# Patient Record
Sex: Female | Born: 1960 | Race: Black or African American | Hispanic: No | Marital: Married | Smoking: Never smoker
Health system: Southern US, Community
[De-identification: ages and names within clinical notes are randomized; demographics above are authoritative.]

## PROBLEM LIST (undated history)

## (undated) DIAGNOSIS — Z973 Presence of spectacles and contact lenses: Secondary | ICD-10-CM

## (undated) DIAGNOSIS — N950 Postmenopausal bleeding: Secondary | ICD-10-CM

## (undated) DIAGNOSIS — I454 Nonspecific intraventricular block: Secondary | ICD-10-CM

## (undated) DIAGNOSIS — M1990 Unspecified osteoarthritis, unspecified site: Secondary | ICD-10-CM

## (undated) DIAGNOSIS — I10 Essential (primary) hypertension: Secondary | ICD-10-CM

## (undated) HISTORY — DX: Nonspecific intraventricular block: I45.4

## (undated) HISTORY — PX: COLONOSCOPY: SHX174

## (undated) HISTORY — DX: Essential (primary) hypertension: I10

## (undated) HISTORY — DX: Unspecified osteoarthritis, unspecified site: M19.90

---

## 1998-05-11 ENCOUNTER — Ambulatory Visit (HOSPITAL_COMMUNITY): Admission: RE | Admit: 1998-05-11 | Discharge: 1998-05-11 | Payer: Self-pay | Admitting: Obstetrics

## 1998-06-29 ENCOUNTER — Inpatient Hospital Stay (HOSPITAL_COMMUNITY): Admission: AD | Admit: 1998-06-29 | Discharge: 1998-06-29 | Payer: Self-pay | Admitting: Obstetrics

## 1998-07-22 ENCOUNTER — Ambulatory Visit (HOSPITAL_COMMUNITY): Admission: RE | Admit: 1998-07-22 | Discharge: 1998-07-22 | Payer: Self-pay | Admitting: Obstetrics

## 1998-09-07 ENCOUNTER — Inpatient Hospital Stay (HOSPITAL_COMMUNITY): Admission: AD | Admit: 1998-09-07 | Discharge: 1998-09-10 | Payer: Self-pay | Admitting: Obstetrics

## 1998-09-07 ENCOUNTER — Encounter: Payer: Self-pay | Admitting: Obstetrics

## 1999-04-20 ENCOUNTER — Other Ambulatory Visit: Admission: RE | Admit: 1999-04-20 | Discharge: 1999-04-20 | Payer: Self-pay | Admitting: Obstetrics

## 1999-06-28 ENCOUNTER — Ambulatory Visit (HOSPITAL_COMMUNITY): Admission: RE | Admit: 1999-06-28 | Discharge: 1999-06-28 | Payer: Self-pay | Admitting: Obstetrics

## 1999-06-28 ENCOUNTER — Encounter: Payer: Self-pay | Admitting: Obstetrics

## 1999-08-11 ENCOUNTER — Inpatient Hospital Stay (HOSPITAL_COMMUNITY): Admission: AD | Admit: 1999-08-11 | Discharge: 1999-08-11 | Payer: Self-pay | Admitting: Obstetrics

## 1999-09-29 ENCOUNTER — Encounter: Payer: Self-pay | Admitting: Obstetrics

## 1999-09-29 ENCOUNTER — Ambulatory Visit (HOSPITAL_COMMUNITY): Admission: RE | Admit: 1999-09-29 | Discharge: 1999-09-29 | Payer: Self-pay | Admitting: Obstetrics

## 1999-10-06 ENCOUNTER — Inpatient Hospital Stay (HOSPITAL_COMMUNITY): Admission: AD | Admit: 1999-10-06 | Discharge: 1999-10-06 | Payer: Self-pay | Admitting: Obstetrics

## 1999-10-12 ENCOUNTER — Encounter (HOSPITAL_COMMUNITY): Admission: AD | Admit: 1999-10-12 | Discharge: 1999-12-08 | Payer: Self-pay | Admitting: Obstetrics

## 1999-10-20 ENCOUNTER — Encounter: Payer: Self-pay | Admitting: Obstetrics

## 1999-11-11 ENCOUNTER — Encounter: Payer: Self-pay | Admitting: Obstetrics

## 1999-11-14 ENCOUNTER — Encounter: Payer: Self-pay | Admitting: Obstetrics

## 1999-11-14 ENCOUNTER — Inpatient Hospital Stay (HOSPITAL_COMMUNITY): Admission: AD | Admit: 1999-11-14 | Discharge: 1999-11-14 | Payer: Self-pay | Admitting: Obstetrics

## 1999-11-29 ENCOUNTER — Inpatient Hospital Stay (HOSPITAL_COMMUNITY): Admission: AD | Admit: 1999-11-29 | Discharge: 1999-11-29 | Payer: Self-pay | Admitting: Obstetrics

## 1999-12-02 ENCOUNTER — Encounter: Payer: Self-pay | Admitting: Obstetrics

## 1999-12-07 ENCOUNTER — Inpatient Hospital Stay (HOSPITAL_COMMUNITY): Admission: AD | Admit: 1999-12-07 | Discharge: 1999-12-12 | Payer: Self-pay | Admitting: Obstetrics

## 2001-01-29 ENCOUNTER — Emergency Department (HOSPITAL_COMMUNITY): Admission: EM | Admit: 2001-01-29 | Discharge: 2001-01-30 | Payer: Self-pay | Admitting: Emergency Medicine

## 2001-11-20 ENCOUNTER — Encounter: Payer: Self-pay | Admitting: Obstetrics

## 2001-11-20 ENCOUNTER — Ambulatory Visit (HOSPITAL_COMMUNITY): Admission: RE | Admit: 2001-11-20 | Discharge: 2001-11-20 | Payer: Self-pay | Admitting: Obstetrics

## 2003-05-27 ENCOUNTER — Encounter: Payer: Self-pay | Admitting: Obstetrics

## 2003-05-27 ENCOUNTER — Ambulatory Visit (HOSPITAL_COMMUNITY): Admission: RE | Admit: 2003-05-27 | Discharge: 2003-05-27 | Payer: Self-pay | Admitting: Obstetrics

## 2003-11-26 ENCOUNTER — Other Ambulatory Visit: Admission: RE | Admit: 2003-11-26 | Discharge: 2003-11-26 | Payer: Self-pay | Admitting: Gynecology

## 2004-07-26 ENCOUNTER — Encounter: Admission: RE | Admit: 2004-07-26 | Discharge: 2004-07-26 | Payer: Self-pay | Admitting: Obstetrics

## 2005-04-12 ENCOUNTER — Ambulatory Visit (HOSPITAL_COMMUNITY): Admission: RE | Admit: 2005-04-12 | Discharge: 2005-04-12 | Payer: Self-pay | Admitting: Obstetrics

## 2005-11-22 ENCOUNTER — Inpatient Hospital Stay (HOSPITAL_COMMUNITY): Admission: RE | Admit: 2005-11-22 | Discharge: 2005-11-25 | Payer: Self-pay | Admitting: Obstetrics

## 2006-01-24 ENCOUNTER — Encounter: Payer: Self-pay | Admitting: Obstetrics

## 2006-05-14 ENCOUNTER — Encounter: Admission: RE | Admit: 2006-05-14 | Discharge: 2006-05-14 | Payer: Self-pay | Admitting: Cardiology

## 2007-11-12 ENCOUNTER — Encounter: Admission: RE | Admit: 2007-11-12 | Discharge: 2007-11-12 | Payer: Self-pay | Admitting: Obstetrics

## 2010-10-15 ENCOUNTER — Encounter: Payer: Self-pay | Admitting: Cardiology

## 2010-10-24 ENCOUNTER — Other Ambulatory Visit (HOSPITAL_COMMUNITY): Payer: Self-pay | Admitting: Obstetrics

## 2010-10-24 DIAGNOSIS — Z1239 Encounter for other screening for malignant neoplasm of breast: Secondary | ICD-10-CM

## 2010-10-31 ENCOUNTER — Other Ambulatory Visit (HOSPITAL_COMMUNITY): Payer: Self-pay | Admitting: Obstetrics

## 2010-10-31 DIAGNOSIS — Z1239 Encounter for other screening for malignant neoplasm of breast: Secondary | ICD-10-CM

## 2010-11-03 ENCOUNTER — Ambulatory Visit (HOSPITAL_COMMUNITY): Payer: Self-pay

## 2010-11-08 ENCOUNTER — Ambulatory Visit
Admission: RE | Admit: 2010-11-08 | Discharge: 2010-11-08 | Disposition: A | Discharge status: Home / Self Care | Payer: BC Managed Care – PPO | Source: Ambulatory Visit | Attending: Obstetrics | Admitting: Obstetrics

## 2010-11-08 DIAGNOSIS — Z1239 Encounter for other screening for malignant neoplasm of breast: Secondary | ICD-10-CM

## 2011-02-10 NOTE — Discharge Summary (Signed)
NAMESHANEECE, STOCKBURGER NO.:  [id number]   MEDICAL RECORD NO.:  [id number]          PATIENT TYPE:  INP   LOCATION:  9145                          FACILITY:  [facility]   PHYSICIAN:  Kathreen Cosier, M.D.DATE OF BIRTH:  Sep 26, 1960   DATE OF ADMISSION:  11/22/2005  DATE OF DISCHARGE:  11/25/2005                                 DISCHARGE SUMMARY   The patient is a 50 year old gravida 3, para 1-0-1-1, EDC December 01, 2005, she  had a previous C-section in the past and a positive GBS.  She was followed  with a nonstress test weekly because of previous history of IUFD and she  desired repeat C-section.  On February 28, she had a repeat section with a  female, Apgars 9 and 9 weighing 7 pounds 7 ounces.  On admission, hemoglobin  was 11.5 and postop 9.2, platelets 264 and 237.  Sodium 135, potassium 4 and  chloride 107.  Urinalysis was negative.  Postoperatively, she did well.  She  was discharged home on the third postoperative day on Tylox.  Hemoglobin was  11.5.  To see me in 6 weeks.   DISCHARGE DIAGNOSIS:  Status post repeat low-transverse cesarean section at  term.           ______________________________  Kathreen Cosier, M.D.     BAM/MEDQ  D:  12/06/2005  T:  12/06/2005  Job:  [id number]

## 2011-02-10 NOTE — Discharge Summary (Signed)
[facility]  Patient:    Ashley Burns, Ashley Burns                    MRN: [id number] Adm. Date:  [id number] Disc. Date: 12/12/99 Attending:  Venita Sheffield                           Discharge Summary  HISTORY OF PRESENT ILLNESS:   The patient is a 50 year old, gravida 2, para 0-1-0-0, with an intrauterine fetal demise in 1999 at 32 weeks.  Her EDC was December 08, 1999.  She was followed with nonstress tests from 32 weeks and she had a positive Group B Strep.  She was admitted for induction of labor at term.  The vertex was at -2 to -3.  The cervix was soft, but closed.  She was treated with IV ampicillin for Group B Strep.  Cytotec was placed and then the patient started n Pitocin.  She received Pitocin for 12 hours and then had ineffective contractions. She was rested, slept at night.  Cytotec reinserted and restarted on the morning of March 16 and received Pitocin for eight hours and had ineffective contractions ith no cervical change and no labor.  HOSPITAL COURSE:              She was delivered by a cesarean section for failed induction.  She had a 7 pound 12 ounce female from the OP position.  Her hemoglobin was 11.4 on admission and 10.9 post cesarean section.  She did well and was discharged home on the third postoperative day, ambulatory, on a regular diet, n Tylox one q.4h. p.r.n. to see me in six weeks.  DISCHARGE DIAGNOSES:          Status post primary low transverse cesarean section at term for failed induction. DD:  12/12/99 TD:  12/12/99 Job: [id number]

## 2011-02-10 NOTE — Op Note (Signed)
[name], Ashley NO.:  [id number]   MEDICAL RECORD NO.:  [id number]          PATIENT TYPE:  INP   LOCATION:  NA                            [facility]ACILITY:  [facility]   PHYSICIAN:  Kathreen Cosier, M.D.DATE OF BIRTH:  [birth year]   DATE OF PROCEDURE:  11/22/2005  DATE OF DISCHARGE:                                 OPERATIVE REPORT   PREOPERATIVE DIAGNOSIS:  Previous cesarean section, at term.  Desires  repeat.   SURGEON:  Kathreen Cosier, M.D.   FIRST ASSISTANT:  Charles A. Clearance Coots, M.D.   ANESTHESIA:  Spinal.   DESCRIPTION OF PROCEDURE:  The patient placed on the operating table in the  supine position.  Abdomen prepped and draped.  Bladder emptied with Foley  catheter.  Transverse suprapubic incision made through the old scar, carried  down through the rectus fascia.  The fascia was cleaned and incised the  length of the incision.  Rectus muscles were retracted laterally.  The  peritoneum was incised longitudinally.  Transverse incision made in the  vesicouterine peritoneum above the bladder.  The bladder mobilized  inferiorly.  Transverse lower uterine incision made.  The fluid was clear.  The patient was delivered from the LOA position of a female.  The Apgars were  9 and 9, weight 7 pounds 7 ounces.  The vacuum was used for delivery.  There  was no pop-off. The team was in attendance.  The placenta was anterior and  sent to labor and delivery.  Uterine cavity was cleaned with dry laps.  Uterine incision closed in one layer with continuous suture of #1 chromic.  Hemostasis was satisfactory.  Tubes and ovaries normal.  Abdomen closed in  layers.  Peritoneum with continuous suture of 0 chromic, fascia with  continuous suture of 0 Dexon and skin closed with subcuticular stitch of 4-0  Monocryl.  Blood loss 600 mL.  The patient tolerated the procedure well,  taken to the recovery room in good condition.           ______________________________  Kathreen Cosier, M.D.     BAM/MEDQ  D:  11/22/2005  T:  11/22/2005  Job:  [id number]

## 2011-10-26 ENCOUNTER — Other Ambulatory Visit: Payer: Self-pay | Admitting: Obstetrics

## 2011-10-26 DIAGNOSIS — Z1231 Encounter for screening mammogram for malignant neoplasm of breast: Secondary | ICD-10-CM

## 2011-11-14 ENCOUNTER — Ambulatory Visit
Admission: RE | Admit: 2011-11-14 | Discharge: 2011-11-14 | Disposition: A | Discharge status: Home / Self Care | Payer: BC Managed Care – PPO | Source: Ambulatory Visit | Attending: Obstetrics | Admitting: Obstetrics

## 2011-11-14 DIAGNOSIS — Z1231 Encounter for screening mammogram for malignant neoplasm of breast: Secondary | ICD-10-CM

## 2012-04-24 ENCOUNTER — Other Ambulatory Visit: Payer: Self-pay | Admitting: Cardiology

## 2012-04-24 ENCOUNTER — Ambulatory Visit
Admission: RE | Admit: 2012-04-24 | Discharge: 2012-04-24 | Disposition: A | Discharge status: Home / Self Care | Payer: BC Managed Care – PPO | Source: Ambulatory Visit | Attending: Cardiology | Admitting: Cardiology

## 2012-04-24 DIAGNOSIS — Z0000 Encounter for general adult medical examination without abnormal findings: Secondary | ICD-10-CM

## 2012-04-24 DIAGNOSIS — R52 Pain, unspecified: Secondary | ICD-10-CM

## 2012-08-29 ENCOUNTER — Other Ambulatory Visit (HOSPITAL_COMMUNITY): Payer: Self-pay | Admitting: *Deleted

## 2012-08-29 ENCOUNTER — Other Ambulatory Visit: Payer: Self-pay | Admitting: Cardiology

## 2012-08-29 DIAGNOSIS — R1310 Dysphagia, unspecified: Secondary | ICD-10-CM

## 2012-08-29 DIAGNOSIS — R109 Unspecified abdominal pain: Secondary | ICD-10-CM

## 2012-09-03 ENCOUNTER — Other Ambulatory Visit: Payer: Self-pay | Admitting: Cardiology

## 2012-09-03 ENCOUNTER — Ambulatory Visit
Admission: RE | Admit: 2012-09-03 | Discharge: 2012-09-03 | Disposition: A | Discharge status: Home / Self Care | Payer: BC Managed Care – PPO | Source: Ambulatory Visit | Attending: Cardiology | Admitting: Cardiology

## 2012-09-03 DIAGNOSIS — R609 Edema, unspecified: Secondary | ICD-10-CM

## 2012-09-03 DIAGNOSIS — R109 Unspecified abdominal pain: Secondary | ICD-10-CM

## 2012-09-05 ENCOUNTER — Ambulatory Visit (HOSPITAL_COMMUNITY)
Admission: RE | Admit: 2012-09-05 | Discharge: 2012-09-05 | Disposition: A | Discharge status: Home / Self Care | Payer: BC Managed Care – PPO | Source: Ambulatory Visit | Attending: Cardiology | Admitting: Cardiology

## 2012-09-05 DIAGNOSIS — M7989 Other specified soft tissue disorders: Secondary | ICD-10-CM

## 2012-09-05 DIAGNOSIS — R609 Edema, unspecified: Secondary | ICD-10-CM

## 2012-09-05 NOTE — Progress Notes (Signed)
*  Preliminary Results* Left lower extremity venous duplex completed. Left lower extremity is negative for deep vein thrombosis. There is no evidence of a left Baker's cyst.  Attempted to call preliminary results into Dr.Spruill's office, however the office was closed. Patient was instructed to go home, and if there are any further instructions for her she will be contacted by phone.  09/05/2012 10:07 AM Gertie Fey, RDMS, RDCS

## 2012-09-11 ENCOUNTER — Ambulatory Visit
Admission: RE | Admit: 2012-09-11 | Discharge: 2012-09-11 | Disposition: A | Discharge status: Home / Self Care | Payer: BC Managed Care – PPO | Source: Ambulatory Visit | Attending: Cardiology | Admitting: Cardiology

## 2012-09-11 DIAGNOSIS — R1310 Dysphagia, unspecified: Secondary | ICD-10-CM

## 2012-09-11 DIAGNOSIS — R109 Unspecified abdominal pain: Secondary | ICD-10-CM

## 2012-10-29 ENCOUNTER — Encounter (INDEPENDENT_AMBULATORY_CARE_PROVIDER_SITE_OTHER): Payer: Self-pay | Admitting: General Surgery

## 2012-10-29 ENCOUNTER — Ambulatory Visit (INDEPENDENT_AMBULATORY_CARE_PROVIDER_SITE_OTHER): Payer: BC Managed Care – PPO | Admitting: General Surgery

## 2012-10-29 VITALS — BP 108/62 | HR 56 | Temp 97.2°F | Resp 12 | Ht 66.0 in | Wt 204.0 lb

## 2012-10-29 DIAGNOSIS — K8020 Calculus of gallbladder without cholecystitis without obstruction: Secondary | ICD-10-CM

## 2012-10-29 NOTE — Progress Notes (Signed)
Patient ID: Ashley Burns, female   DOB: 01/25/1961, 52 y.o.   MRN: [id number]  Chief Complaint  Patient presents with  . New Evaluation    eval GB w/stones    HPI Ashley Burns is a 52 y.o. female.  For the evaluation of incidental gallstones noted on a recent ultrasound. HPI  The patient was being evaluated for esophageal spasms and discomfort. She was having some dysphasia. In the process an ultrasound of her abdomen was done which showed multiple gallstones.  I question the patient and her husband extensively for possible symptoms related to her gallstones. She's had no nausea or vomiting. She's had no abdominal pain on the right side or the epigastrium. She's had some mild left-sided abdominal pain however this was only yesterday. She's had no jaundice and no fevers or chills.  The problem that was being evaluated has resolved. She no longer has a dysphasia. History reviewed. No pertinent past medical history.  Past Surgical History  Procedure Date  . Cesarean section     x3    Family History  Problem Relation Age of Onset  . Cancer Mother     breast  . Diabetes Father   . Hypertension Father   . Heart disease Father   . Cancer Cousin     breast    Social History History  Substance Use Topics  . Smoking status: Never Smoker   . Smokeless tobacco: Never Used  . Alcohol Use: Not on file    No Known Allergies  No current outpatient prescriptions on file.    Review of Systems Review of Systems  Constitutional: Negative.   HENT: Negative.   Eyes: Negative.   Respiratory: Negative.   Cardiovascular: Negative.   Gastrointestinal: Negative.   Genitourinary: Negative.   Musculoskeletal: Negative.   Neurological: Negative.   Hematological: Negative.   Psychiatric/Behavioral: Negative.     Blood pressure 108/62, pulse 56, temperature 97.2 F (36.2 C), temperature source Temporal, resp. rate 12, height 5\' 6"  (1.676 m), weight 204 lb (92.534  kg).  Physical Exam Physical Exam  Constitutional: She is oriented to person, place, and time. She appears well-developed and well-nourished.  HENT:  Head: Normocephalic and atraumatic.  Eyes: Conjunctivae normal and EOM are normal. Pupils are equal, round, and reactive to light.  Neck: Normal range of motion. Neck supple.  Cardiovascular: Normal rate, regular rhythm and normal heart sounds.   Pulmonary/Chest: Effort normal.  Abdominal: Soft. Bowel sounds are normal. There is tenderness (mild LUQ tenderness).  Musculoskeletal: Normal range of motion.  Neurological: She is alert and oriented to person, place, and time. She has normal reflexes.  Skin: Skin is warm and dry.  Psychiatric: She has a normal mood and affect. Her behavior is normal. Judgment and thought content normal.    Data Reviewed Ultrasound  Assessment    Asymptomatic gallstones    Plan    Did not recommend surgery.  Watch diet as the patient is doing.  Come back to see me if there increase in symptoms.       Cherylynn Ridges 10/29/2012, 11:01 AM

## 2012-12-03 ENCOUNTER — Other Ambulatory Visit: Payer: Self-pay | Admitting: Obstetrics

## 2012-12-03 ENCOUNTER — Ambulatory Visit (INDEPENDENT_AMBULATORY_CARE_PROVIDER_SITE_OTHER): Payer: BC Managed Care – PPO

## 2012-12-03 DIAGNOSIS — Z1231 Encounter for screening mammogram for malignant neoplasm of breast: Secondary | ICD-10-CM

## 2012-12-03 DIAGNOSIS — R928 Other abnormal and inconclusive findings on diagnostic imaging of breast: Secondary | ICD-10-CM

## 2012-12-11 ENCOUNTER — Other Ambulatory Visit: Payer: Self-pay | Admitting: Obstetrics

## 2012-12-11 DIAGNOSIS — R928 Other abnormal and inconclusive findings on diagnostic imaging of breast: Secondary | ICD-10-CM

## 2012-12-20 ENCOUNTER — Other Ambulatory Visit: Payer: BC Managed Care – PPO

## 2014-08-04 ENCOUNTER — Other Ambulatory Visit: Payer: Self-pay | Admitting: Internal Medicine

## 2014-08-04 DIAGNOSIS — E049 Nontoxic goiter, unspecified: Secondary | ICD-10-CM

## 2014-08-12 ENCOUNTER — Ambulatory Visit (HOSPITAL_COMMUNITY)
Admission: RE | Admit: 2014-08-12 | Discharge: 2014-08-12 | Disposition: A | Discharge status: Home / Self Care | Payer: BC Managed Care – PPO | Source: Ambulatory Visit | Attending: Internal Medicine | Admitting: Internal Medicine

## 2014-08-12 DIAGNOSIS — E049 Nontoxic goiter, unspecified: Secondary | ICD-10-CM

## 2015-09-13 LAB — CYTOLOGY - PAP: PAP SMEAR: NEGATIVE

## 2016-07-10 ENCOUNTER — Ambulatory Visit: Payer: Self-pay | Admitting: Family Medicine

## 2016-07-18 NOTE — Progress Notes (Signed)
[facility] [place] Ashley Burns, Ashley Burns [address] Phone: [phone] Subjective:    I'm seeing this patient by the request  of:  SPRUILL,JEROME O, MD   CC: Right knee pain  [id number]  Ashley Burns is a 55 y.o. female coming in with complaint of right knee pain. Patient is had this pain for quite some time. Think since more of a popping sensation that to be what is concerning. Patient was running and then unfortunately had to stop. Has gained some weight since then. Now notices when she walks she has an audible clicking on the anterior aspect of the right knee and somewhat on the left knee. Patient is concerned she is doing damage. Denies any swelling, any radiation of pain, any numbness or tingling. Denies any instability of the knee. Denies any nighttime awakening. Rates the severity of pain is 3 out of 10 but is concerned with the noise more than anything else.     No past medical history on file. Past Surgical History:  Procedure Laterality Date  . CESAREAN SECTION     x3   Social History   Social History  . Marital status: Married    Spouse name: N/A  . Number of children: N/A  . Years of education: N/A   Social History Main Topics  . Smoking status: Never Smoker  . Smokeless tobacco: Never Used  . Alcohol use Not on file  . Drug use: No  . Sexual activity: Not on file   Other Topics Concern  . Not on file   Social History Narrative  . No narrative on file   No Known Allergies Family History  Problem Relation Age of Onset  . Cancer Mother     breast  . Diabetes Father   . Hypertension Father   . Heart disease Father   . Cancer Cousin     breast    Past medical history, social, surgical and family history all reviewed in electronic medical record.  No pertanent information unless stated regarding to the chief complaint.   Review of Systems: No headache, visual changes, nausea, vomiting, diarrhea, constipation,  dizziness, abdominal pain, skin rash, fevers, chills, night sweats, weight loss, swollen lymph nodes, body aches, joint swelling, muscle aches, chest pain, shortness of breath, mood changes.   Objective  There were no vitals taken for this visit.  General: No apparent distress alert and oriented x3 mood and affect normal, dressed appropriately.  HEENT: Pupils equal, extraocular movements intact  Respiratory: Patient's speak in full sentences and does not appear short of breath  Cardiovascular: No lower extremity edema, non tender, no erythema  Skin: Warm dry intact with no signs of infection or rash on extremities or on axial skeleton.  Abdomen: Soft nontender  Neuro: Cranial nerves II through XII are intact, neurovascularly intact in all extremities with 2+ DTRs and 2+ pulses.  Lymph: No lymphadenopathy of posterior or anterior cervical chain or axillae bilaterally.  Gait normal with good balance and coordination.  MSK:  Non tender with full range of motion and good stability and symmetric strength and tone of shoulders, elbows, wrist, hip, and ankles bilaterally.  Pes planus bilaterally Knee: Right Lateral tilt of the patella noted Tender to palpation minorly over the superior lateral patellofemoral joint.Marland Kitchen ROM full in flexion and extension and lower leg rotation. Ligaments with solid consistent endpoints including ACL, PCL, LCL, MCL. Negative Mcmurray's, Apley's, and Thessalonian tests. Mild painful patellar compression. Patellar glide with  mild crepitus. Patellar and quadriceps tendons unremarkable. Hamstring and quadriceps strength is normal.  Contralateral knee has some mild crepitus is well but nontender  MSK US performed of: Right This study was ordered, performed, and interpreted by Charlann Boxer D.O.  Knee: All structures visualized. Mild narrowing of the patellofemoral space mostly on the lateral aspect. Patient has no joint effusion. 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  This included exercises focusing on stretching, strengthening, with significant focus on eccentric aspects.  Patellofemoral Syndrome  Reviewed anatomy using anatomical Burns and how PFS occurs.  Given rehab exercises handout for VMO, hip abductors, core, entire kinetic chain including proprioception exercises including cone touches, step downs, hip elevations and turn outs.  Could benefit from PT, regular exercise, upright biking, and a PFS knee brace to assist with tracking abnormalities.  Proper technique shown and discussed handout in great detail with ATC.  All questions were discussed and answered.    Impression and Recommendations:     This case required medical decision making of moderate complexity.      Note: This dictation was prepared with Dragon dictation along with smaller phrase technology. Any transcriptional errors that result from this process are unintentional.

## 2016-07-19 ENCOUNTER — Ambulatory Visit: Payer: Self-pay

## 2016-07-19 ENCOUNTER — Ambulatory Visit (INDEPENDENT_AMBULATORY_CARE_PROVIDER_SITE_OTHER): Payer: BLUE CROSS/BLUE SHIELD | Admitting: Family Medicine

## 2016-07-19 ENCOUNTER — Encounter: Payer: Self-pay | Admitting: Family Medicine

## 2016-07-19 VITALS — BP 110/80 | HR 57 | Wt 233.0 lb

## 2016-07-19 DIAGNOSIS — M25561 Pain in right knee: Secondary | ICD-10-CM

## 2016-07-19 DIAGNOSIS — M222X1 Patellofemoral disorders, right knee: Secondary | ICD-10-CM | POA: Diagnosis not present

## 2016-07-19 DIAGNOSIS — M2141 Flat foot [pes planus] (acquired), right foot: Secondary | ICD-10-CM | POA: Diagnosis not present

## 2016-07-19 DIAGNOSIS — M2140 Flat foot [pes planus] (acquired), unspecified foot: Secondary | ICD-10-CM | POA: Insufficient documentation

## 2016-07-19 DIAGNOSIS — M222X2 Patellofemoral disorders, left knee: Secondary | ICD-10-CM

## 2016-07-19 DIAGNOSIS — M1711 Unilateral primary osteoarthritis, right knee: Secondary | ICD-10-CM | POA: Diagnosis not present

## 2016-07-19 DIAGNOSIS — E669 Obesity, unspecified: Secondary | ICD-10-CM | POA: Insufficient documentation

## 2016-07-19 DIAGNOSIS — M2142 Flat foot [pes planus] (acquired), left foot: Secondary | ICD-10-CM

## 2016-07-19 MED ORDER — DICLOFENAC SODIUM 2 % TD SOLN: TRANSDERMAL | 3 refills | Status: DC

## 2016-07-19 NOTE — Patient Instructions (Signed)
Good to see you.  Ice 20 minutes 2 times daily. Usually after activity and before bed. Exercises 3 times a week.  pennsaid pinkie amount topically 2 times daily as needed.  Vitamin D 2000 Udaily  Turmeric 500mg  twice daily  Try to do biking or elliptical for lower impact  See me again in 4 weeks.

## 2016-07-19 NOTE — Assessment & Plan Note (Signed)
Discussed OTC medications Discussed over-the-counter orthotics.

## 2016-07-19 NOTE — Assessment & Plan Note (Signed)
Patient patellofemoral syndrome. Also some underlying arthritis. Topical anti-inflammatories written for, home exercises given, we discussed icing regimen. Patient will stay active. Discussed proper shoes and over-the-counter orthotics for her pes planus. We discussed the importance of weight loss. Follow-up again in 4 weeks

## 2016-08-16 ENCOUNTER — Ambulatory Visit: Payer: BLUE CROSS/BLUE SHIELD | Admitting: Family Medicine

## 2016-09-04 ENCOUNTER — Ambulatory Visit: Payer: BLUE CROSS/BLUE SHIELD | Admitting: Family Medicine

## 2016-09-09 NOTE — Progress Notes (Deleted)
[facility] [place], [place] [address] Phone: [phone] Subjective:    I'm seeing this patient by the request  of:  SPRUILL,JEROME O, MD   CC: Right knee pain  [id number]  Ashley Burns is a 55 y.o. female coming in with complaint of right knee pain. Patient is had this pain for quite some time. Think since more of a popping sensation that to be what is concerning. Patient was running and then unfortunately had to stop. Has gained some weight since then. Now notices when she walks she has an audible clicking on the anterior aspect of the right knee and somewhat on the left knee. Patient is concerned she is doing damage. Denies any swelling, any radiation of pain, any numbness or tingling. Denies any instability of the knee. Denies any nighttime awakening. Rates the severity of pain is 3 out of 10 but is concerned with the noise more than anything else.     No past medical history on file. Past Surgical History:  Procedure Laterality Date  . CESAREAN SECTION     x3   Social History   Social History  . Marital status: Married    Spouse name: N/A  . Number of children: N/A  . Years of education: N/A   Social History Main Topics  . Smoking status: Never Smoker  . Smokeless tobacco: Never Used  . Alcohol use Not on file  . Drug use: No  . Sexual activity: Not on file   Other Topics Concern  . Not on file   Social History Narrative  . No narrative on file   No Known Allergies Family History  Problem Relation Age of Onset  . Cancer Mother     breast  . Diabetes Father   . Hypertension Father   . Heart disease Father   . Cancer Cousin     breast    Past medical history, social, surgical and family history all reviewed in electronic medical record.  No pertanent information unless stated regarding to the chief complaint.   Review of Systems: No headache, visual changes, nausea, vomiting, diarrhea, constipation,  dizziness, abdominal pain, skin rash, fevers, chills, night sweats, weight loss, swollen lymph nodes, body aches, joint swelling, muscle aches, chest pain, shortness of breath, mood changes.   Objective  There were no vitals taken for this visit.  General: No apparent distress alert and oriented x3 mood and affect normal, dressed appropriately.  HEENT: Pupils equal, extraocular movements intact  Respiratory: Patient's speak in full sentences and does not appear short of breath  Cardiovascular: No lower extremity edema, non tender, no erythema  Skin: Warm dry intact with no signs of infection or rash on extremities or on axial skeleton.  Abdomen: Soft nontender  Neuro: Cranial nerves II through XII are intact, neurovascularly intact in all extremities with 2+ DTRs and 2+ pulses.  Lymph: No lymphadenopathy of posterior or anterior cervical chain or axillae bilaterally.  Gait normal with good balance and coordination.  MSK:  Non tender with full range of motion and good stability and symmetric strength and tone of shoulders, elbows, wrist, hip, and ankles bilaterally.  Pes planus bilaterally Knee: Right Lateral tilt of the patella noted Tender to palpation minorly over the superior lateral patellofemoral joint.Marland Kitchen ROM full in flexion and extension and lower leg rotation. Ligaments with solid consistent endpoints including ACL, PCL, LCL, MCL. Negative Mcmurray's, Apley's, and Thessalonian tests. Mild painful patellar compression. Patellar glide with  mild crepitus. Patellar and quadriceps tendons unremarkable. Hamstring and quadriceps strength is normal.  Contralateral knee has some mild crepitus is well but nontender  MSK US performed of: Right This study was ordered, performed, and interpreted by Charlann Boxer D.O.  Knee: All structures visualized. Mild narrowing of the patellofemoral space mostly on the lateral aspect. Patient has no joint effusion. 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  This included exercises focusing on stretching, strengthening, with significant focus on eccentric aspects.  Patellofemoral Syndrome  Reviewed anatomy using anatomical model and how PFS occurs.  Given rehab exercises handout for VMO, hip abductors, core, entire kinetic chain including proprioception exercises including cone touches, step downs, hip elevations and turn outs.  Could benefit from PT, regular exercise, upright biking, and a PFS knee brace to assist with tracking abnormalities.  Proper technique shown and discussed handout in great detail with ATC.  All questions were discussed and answered.    Impression and Recommendations:     This case required medical decision making of moderate complexity.      Note: This dictation was prepared with Dragon dictation along with smaller phrase technology. Any transcriptional errors that result from this process are unintentional.

## 2016-09-11 ENCOUNTER — Ambulatory Visit: Payer: BLUE CROSS/BLUE SHIELD | Admitting: Family Medicine

## 2017-06-01 ENCOUNTER — Other Ambulatory Visit: Payer: Self-pay | Admitting: Obstetrics and Gynecology

## 2017-11-16 ENCOUNTER — Other Ambulatory Visit: Payer: Self-pay | Admitting: Internal Medicine

## 2017-11-16 DIAGNOSIS — R221 Localized swelling, mass and lump, neck: Secondary | ICD-10-CM

## 2017-11-22 ENCOUNTER — Other Ambulatory Visit: Payer: BLUE CROSS/BLUE SHIELD

## 2017-11-26 ENCOUNTER — Ambulatory Visit
Admission: RE | Admit: 2017-11-26 | Discharge: 2017-11-26 | Disposition: A | Discharge status: Home / Self Care | Payer: BLUE CROSS/BLUE SHIELD | Source: Ambulatory Visit | Attending: Internal Medicine | Admitting: Internal Medicine

## 2017-11-26 DIAGNOSIS — R221 Localized swelling, mass and lump, neck: Secondary | ICD-10-CM

## 2018-07-24 ENCOUNTER — Telehealth: Payer: Self-pay | Admitting: Internal Medicine

## 2018-07-24 NOTE — Telephone Encounter (Signed)
Patient has an appoint with Doreene Burke for her HM on Nov 15 and wants her to review Dr. Tamala Julian notes regarding her knees

## 2018-08-02 ENCOUNTER — Telehealth: Payer: Self-pay

## 2018-08-02 NOTE — Telephone Encounter (Signed)
Voicemail full on home phone and not setup on the cell phone.  I was returning the pt's call to reschedule her appt for Nov. 15th. For her physical.

## 2018-08-09 ENCOUNTER — Encounter: Payer: Self-pay | Admitting: Nurse Practitioner

## 2019-12-18 ENCOUNTER — Ambulatory Visit: Payer: Self-pay | Attending: Internal Medicine

## 2019-12-18 DIAGNOSIS — Z23 Encounter for immunization: Secondary | ICD-10-CM

## 2019-12-18 NOTE — Progress Notes (Signed)
   Covid-19 Vaccination Clinic  Name:  Ashley Burns    MRN: [id number] DOB: 05/02/1961  12/18/2019  [name] was observed post Covid-19 immunization for 15 minutes without incident. She was provided with Vaccine Information Sheet and instruction to access the V-Safe system.   [name] was instructed to call 911 with any severe reactions post vaccine: Marland Kitchen Difficulty breathing  . Swelling of face and throat  . A fast heartbeat  . A bad rash all over body  . Dizziness and weakness   Immunizations Administered    Name Date Dose VIS Date Route   Pfizer COVID-19 Vaccine 12/18/2019  3:20 PM 0.3 mL 09/05/2019 Intramuscular   Manufacturer: [place]   Lot: [id number]   [place]: [id number]

## 2020-01-12 ENCOUNTER — Ambulatory Visit: Payer: Self-pay | Attending: Internal Medicine

## 2020-01-12 DIAGNOSIS — Z23 Encounter for immunization: Secondary | ICD-10-CM

## 2020-01-12 NOTE — Progress Notes (Signed)
   Covid-19 Vaccination Clinic  Name:  Ashley Burns    MRN: [id number] DOB: December 03, 1960  01/12/2020  [name] was observed post Covid-19 immunization for 15 minutes without incident. She was provided with Vaccine Information Sheet and instruction to access the V-Safe system.   [name] was instructed to call 911 with any severe reactions post vaccine: Marland Kitchen Difficulty breathing  . Swelling of face and throat  . A fast heartbeat  . A bad rash all over body  . Dizziness and weakness   Immunizations Administered    Name Date Dose VIS Date Route   Pfizer COVID-19 Vaccine 01/12/2020  3:13 PM 0.3 mL 11/19/2018 Intramuscular   Manufacturer: [place]   Lot: [id number]   [place]: [id number]

## 2020-07-13 ENCOUNTER — Encounter: Payer: Self-pay | Admitting: Emergency Medicine

## 2020-07-13 ENCOUNTER — Other Ambulatory Visit: Payer: Self-pay

## 2020-07-13 ENCOUNTER — Emergency Department (INDEPENDENT_AMBULATORY_CARE_PROVIDER_SITE_OTHER)
Admission: EM | Admit: 2020-07-13 | Discharge: 2020-07-13 | Disposition: A | Discharge status: Home / Self Care | Payer: BC Managed Care – PPO | Source: Home / Self Care

## 2020-07-13 DIAGNOSIS — M5432 Sciatica, left side: Secondary | ICD-10-CM | POA: Diagnosis not present

## 2020-07-13 DIAGNOSIS — M5431 Sciatica, right side: Secondary | ICD-10-CM

## 2020-07-13 MED ORDER — DEXAMETHASONE SODIUM PHOSPHATE 10 MG/ML IJ SOLN
10.0000 mg | Freq: Once | INTRAMUSCULAR | Status: AC
  Administered 2020-07-13: 10 mg via INTRAMUSCULAR

## 2020-07-13 MED ORDER — TIZANIDINE HCL 4 MG PO TABS: 4.0000 mg | ORAL_TABLET | Freq: Every evening | ORAL | 0 refills | Status: DC | PRN

## 2020-07-13 MED ORDER — DICLOFENAC SODIUM 50 MG PO TBEC: 50.0000 mg | DELAYED_RELEASE_TABLET | Freq: Two times a day (BID) | ORAL | 0 refills | Status: DC | PRN

## 2020-07-13 MED ORDER — KETOROLAC TROMETHAMINE 60 MG/2ML IM SOLN
60.0000 mg | Freq: Once | INTRAMUSCULAR | Status: AC
  Administered 2020-07-13: 60 mg via INTRAMUSCULAR

## 2020-07-13 NOTE — ED Triage Notes (Signed)
Low back pain x 6 days. Worked at [employer] and strained back, possibly lifting something. Not sure.Today lifted her briefcase and it got worse.

## 2020-07-13 NOTE — ED Provider Notes (Signed)
Ashley Burns CARE    CSN: [id number] Arrival date & time: 07/13/20  1923      History   Chief Complaint Chief Complaint  Patient presents with  . Back Pain    HPI Ashley Burns is a 59 y.o. female.   HPI Patient presents today with acute back pain x6 days. 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 Today while working she lifted a briefcase and noticed back pain had worsened. She has taken over the counter medication without relief of symptoms. History reviewed. No pertinent past medical history.  Patient Active Problem List   Diagnosis Date Noted  . Patellofemoral syndrome of both knees 07/19/2016  . Pes planus 07/19/2016  . Degenerative arthritis of right knee 07/19/2016  . Obesity 07/19/2016  . Asymptomatic gallstones 10/29/2012    Past Surgical History:  Procedure Laterality Date  . CESAREAN SECTION     x3    OB History   No obstetric history on file.      Home Medications    Prior to Admission medications   Not on File    Family History Family History  Problem Relation Age of Onset  . Cancer Mother        breast  . Diabetes Father   . Hypertension Father   . Heart disease Father   . Cancer Cousin        breast    Social History Social History   Tobacco Use  . Smoking status: Never Smoker  . Smokeless tobacco: Never Used  Substance Use Topics  . Alcohol use: Yes  . Drug use: No     Allergies   Patient has no known allergies. Review of Systems Review of Systems Pertinent negatives listed in HPI  Physical Exam Triage Vital Signs ED Triage Vitals  Enc Vitals Group     BP 07/13/20 1936 (!) 147/97     Pulse Rate 07/13/20 1936 62     Resp --      Temp 07/13/20 1936 98 F (36.7 C)     Temp Source 07/13/20 1936 Oral     SpO2 07/13/20 1936 96 %     Weight 07/13/20 1937 230 lb (104.3 kg)     Height 07/13/20 1937 5\' 6"  (1.676 m)     Head  Circumference --      Peak Flow --      Pain Score 07/13/20 1937 9     Pain Loc --      Pain Edu? --      Excl. in [place]? --    No data found.  Updated Vital Signs BP (!) 147/97 (BP Location: Right Arm)   Pulse 62   Temp 98 F (36.7 C) (Oral)   Ht 5\' 6"  (1.676 m)   Wt 230 lb (104.3 kg)   SpO2 96%   BMI 37.12 kg/m   Visual Acuity Right Eye Distance:   Left Eye Distance:   Bilateral Distance:    Right Eye Near:   Left Eye Near:    Bilateral Near:     Physical Exam Constitutional:      Appearance: She is obese. She is not ill-appearing or toxic-appearing.  Cardiovascular:     Rate and Rhythm: Normal rate.  Pulmonary:     Effort: Pulmonary effort is normal.     Breath sounds: Normal breath sounds.  Musculoskeletal:     Cervical back: Normal range of  motion.     Thoracic back: Normal.     Lumbar back: Tenderness present. Decreased range of motion. Positive right straight leg raise test and positive left straight leg raise test.  Skin:    General: Skin is warm.     Capillary Refill: Capillary refill takes less than 2 seconds.  Neurological:     General: No focal deficit present.     Mental Status: She is alert and oriented to person, place, and time.     Motor: No weakness.  Psychiatric:        Attention and Perception: Attention normal.        Mood and Affect: Mood normal.        Speech: Speech normal.    UC Treatments / Results  Labs (all labs ordered are listed, but only abnormal results are displayed) Labs Reviewed - No data to display  EKG   Radiology No results found.  Procedures Procedures (including critical care time)  Medications Ordered in UC Medications  ketorolac (TORADOL) injection 60 mg (60 mg Intramuscular Given 07/13/20 2000)  dexamethasone (DECADRON) injection 10 mg (10 mg Intramuscular Given 07/13/20 2000)    Initial Impression / Assessment and Plan / UC Course  I have reviewed the triage vital signs and the nursing  notes.  Pertinent labs & imaging results that were available during my care of the patient were reviewed by me and considered in my medical decision making (see chart for details).    Acute bilateral sciatica, Toradol and Decadron IM given today. Zanaflex and Diclofenac PRN as needed. Follow-up with PCP  or orthopedics if symptoms worsen or do not improve . Final Clinical Impressions(s) / UC Diagnoses   Final diagnoses:  Bilateral sciatica     Discharge Instructions     Start Voltaren 50 mg once daily as needed.  You may take tizanidine 1 hour prior to bedtime as needed which is a muscle relaxer.  Avoid driving while taking medication.  Symptoms should resolve within the next 3 to 5 days.  Avoid any strenuous or heavy lifting activity as this can delay symptoms resolving.    ED Prescriptions    Medication Sig Dispense Auth. Provider   diclofenac (VOLTAREN) 50 MG EC tablet Take 1 tablet (50 mg total) by mouth 2 (two) times daily as needed. 30 tablet Scot Jun, FNP   tiZANidine (ZANAFLEX) 4 MG tablet Take 1 tablet (4 mg total) by mouth at bedtime as needed and may repeat dose one time if needed for muscle spasms. 30 tablet Scot Jun, FNP     PDMP not reviewed this encounter.   Scot Jun, [place] 07/16/20 [phone]

## 2020-07-13 NOTE — Discharge Instructions (Signed)
Start Voltaren 50 mg once daily as needed.  You may take tizanidine 1 hour prior to bedtime as needed which is a muscle relaxer.  Avoid driving while taking medication.  Symptoms should resolve within the next 3 to 5 days.  Avoid any strenuous or heavy lifting activity as this can delay symptoms resolving.

## 2020-07-25 ENCOUNTER — Other Ambulatory Visit: Payer: Self-pay | Admitting: Family Medicine

## 2020-11-15 ENCOUNTER — Telehealth: Payer: Self-pay

## 2020-11-15 NOTE — Telephone Encounter (Addendum)
The pt declined to see an NP because she said she wanted to have a physical with Dr. Baird Cancer.  The pt was told that Dr sanders physicals are scheduled out late in the year.  The pt said she was fine with waiting to see Dr. Baird Cancer.

## 2020-12-28 ENCOUNTER — Ambulatory Visit: Payer: Self-pay | Admitting: Nurse Practitioner

## 2021-01-03 ENCOUNTER — Encounter: Payer: Self-pay | Admitting: Nurse Practitioner

## 2021-01-03 ENCOUNTER — Ambulatory Visit: Payer: BC Managed Care – PPO | Admitting: Nurse Practitioner

## 2021-01-03 ENCOUNTER — Other Ambulatory Visit: Payer: Self-pay

## 2021-01-03 VITALS — BP 118/72 | HR 68 | Temp 98.1°F | Ht 66.0 in | Wt 249.0 lb

## 2021-01-03 DIAGNOSIS — Z1159 Encounter for screening for other viral diseases: Secondary | ICD-10-CM

## 2021-01-03 DIAGNOSIS — M25561 Pain in right knee: Secondary | ICD-10-CM

## 2021-01-03 DIAGNOSIS — R350 Frequency of micturition: Secondary | ICD-10-CM

## 2021-01-03 DIAGNOSIS — N898 Other specified noninflammatory disorders of vagina: Secondary | ICD-10-CM

## 2021-01-03 DIAGNOSIS — R2241 Localized swelling, mass and lump, right lower limb: Secondary | ICD-10-CM

## 2021-01-03 DIAGNOSIS — Z1211 Encounter for screening for malignant neoplasm of colon: Secondary | ICD-10-CM

## 2021-01-03 DIAGNOSIS — M25562 Pain in left knee: Secondary | ICD-10-CM | POA: Diagnosis not present

## 2021-01-03 LAB — POCT URINALYSIS DIPSTICK
Bilirubin, UA: NEGATIVE
Blood, UA: NEGATIVE
Glucose, UA: NEGATIVE
Ketones, UA: NEGATIVE
Leukocytes, UA: NEGATIVE
Nitrite, UA: NEGATIVE
Protein, UA: NEGATIVE
Spec Grav, UA: 1.025 (ref 1.010–1.025)
Urobilinogen, UA: 1 E.U./dL
pH, UA: 6 (ref 5.0–8.0)

## 2021-01-03 NOTE — Progress Notes (Signed)
I,Yamilka Roman [employer] as a Education administrator for [employer], FNP.,have documented all relevant documentation on the behalf of Minette Brine, FNP,as directed by  Minette Brine, FNP while in the presence of Minette Brine, [place]. This visit occurred during the SARS-CoV-2 public health emergency.  Safety protocols were in place, including screening questions prior to the visit, additional usage of staff PPE, and extensive cleaning of exam room while observing appropriate contact time as indicated for disinfecting solutions.  Subjective:     Patient ID: Ashley Burns , female    DOB: 06-Aug-1961 , 60 y.o.   MRN: [id number]   Chief Complaint  Patient presents with  . Back Pain  . Hip Pain    HPI  Patient presents today for an eval on an injury that she had back in Oct. 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 She stated she went to 3 different places for workers comp so she was told to come in to her pcp to be checked. She stated she was doing physical therapy and she feels they may have injured her, she feels like her knees were pulled out of socket. She also feels like there is a lump on her right thigh. She also reports having some urinary frequency. She has tried to see workers comp but there were staffing issues. She reports feeling like her knees were out of joint with her PT, reports she hears "funny noises to her knees". When she lies on right side she feels a "mound on her right hip". Had improved and now does not feel like progressing in the right direction. Does not get massages on a regular basis.   She has her office down stairs due to having pain to her right low back. She used to walk about 5 miles a day and would like to get back to exercising.    She is also having an increase in urination about every 2 hours.  She will sometimes have increased thirst.    She had injections after having pain after the injury  Right lump to thigh    History reviewed. No  pertinent past medical history.   Family History  Problem Relation Age of Onset  . Cancer Mother        breast  . Diabetes Father   . Hypertension Father   . Heart disease Father   . Cancer Cousin        breast     Current Outpatient Medications:  .  metroNIDAZOLE (FLAGYL) 500 MG tablet, Take 1 tablet (500 mg total) by mouth 3 (three) times daily for 10 days., Disp: 30 tablet, Rfl: 0   No Known Allergies   Review of Systems  Constitutional: Negative.   Respiratory: Negative.   Cardiovascular: Negative.  Negative for chest pain, palpitations and leg swelling.  Genitourinary: Negative.   Musculoskeletal: Positive for arthralgias. Negative for myalgias.  Neurological: Negative for dizziness.  Psychiatric/Behavioral: Negative.      Today's Vitals   01/03/21 1546  BP: 118/72  Pulse: 68  Temp: 98.1 F (36.7 C)  TempSrc: Oral  Weight: 249 lb (112.9 kg)  Height: 5' 6"  (1.676 m)  PainSc: 3    Body mass index is 40.19 kg/m.   Objective:  Physical Exam Vitals reviewed.  Constitutional:      General: She is not in acute distress.    Appearance: Normal appearance. She is obese.  Cardiovascular:     Rate and Rhythm: Normal rate  and regular rhythm.     Pulses: Normal pulses.     Heart sounds: Normal heart sounds. No murmur heard.   Pulmonary:     Effort: Pulmonary effort is normal. No respiratory distress.     Breath sounds: Normal breath sounds. No wheezing.  Skin:    Comments: Right thigh with lump present to anterior area  Neurological:     General: No focal deficit present.     Mental Status: She is alert and oriented to person, place, and time.     Cranial Nerves: No cranial nerve deficit.  Psychiatric:        Mood and Affect: Mood normal.        Behavior: Behavior normal.        Thought Content: Thought content normal.        Judgment: Judgment normal.         Assessment And Plan:     1. Urinary frequency  Urinalysis is normal  Will check for  metabolic causes - POCT Urinalysis Dipstick (81002) - Hemoglobin A1c - BMP8+eGFR - TSH  2. Acute pain of both knees  Will check knee xray due to ongoing pain - DG Knee Complete 4 Views Left; Future - DG Knee Complete 4 Views Right; Future  3. Encounter for hepatitis C screening test for low risk patient  Will check Hepatitis C screening due to recent recommendations to screen all adults 18 years and older - Hepatitis C antibody  4. Lump of right thigh  5. Encounter for screening colonoscopy  According to USPTF Colorectal cancer Screening guidelines. Colonoscopy is recommended every 10 years, starting at age 38 years.  Will refer to GI for colon cancer screening. - Ambulatory referral to Gastroenterology  6. Vaginal odor - metroNIDAZOLE (FLAGYL) 500 MG tablet; Take 1 tablet (500 mg total) by mouth 3 (three) times daily for 10 days.  Dispense: 30 tablet; Refill: 0     Patient was given opportunity to ask questions. Patient verbalized understanding of the plan and was able to repeat key elements of the plan. All questions were answered to their satisfaction.  Minette Brine, FNP   I, Minette Brine, FNP, have reviewed all documentation for this visit. The documentation on 01/11/21 for the exam, diagnosis, procedures, and orders are all accurate and complete.   IF YOU HAVE BEEN REFERRED TO A SPECIALIST, IT MAY TAKE 1-2 WEEKS TO SCHEDULE/PROCESS THE REFERRAL. IF YOU HAVE NOT HEARD FROM US/SPECIALIST IN TWO WEEKS, PLEASE GIVE [place] A CALL AT [phone].   THE PATIENT IS ENCOURAGED TO PRACTICE SOCIAL DISTANCING DUE TO THE COVID-19 PANDEMIC.

## 2021-01-04 LAB — BMP8+EGFR
BUN/Creatinine Ratio: 23 (ref 9–23)
BUN: 14 mg/dL (ref 6–24)
CO2: 22 mmol/L (ref 20–29)
Calcium: 9.6 mg/dL (ref 8.7–10.2)
Chloride: 101 mmol/L (ref 96–106)
Creatinine, Ser: 0.6 mg/dL (ref 0.57–1.00)
Glucose: 91 mg/dL (ref 65–99)
Potassium: 4 mmol/L (ref 3.5–5.2)
Sodium: 139 mmol/L (ref 134–144)
eGFR: 103 mL/min/{1.73_m2} (ref >59)

## 2021-01-04 LAB — HEPATITIS C ANTIBODY: Hep C Virus Ab: <0.1 s/co ratio (ref 0.0–0.9)

## 2021-01-04 LAB — HEMOGLOBIN A1C
Est. average glucose Bld gHb Est-mCnc: 123 mg/dL
Hgb A1c MFr Bld: 5.9 % — ABNORMAL HIGH (ref 4.8–5.6)

## 2021-01-04 LAB — TSH: TSH: 2.76 u[IU]/mL (ref 0.450–4.500)

## 2021-01-06 MED ORDER — METRONIDAZOLE 500 MG PO TABS: 500.0000 mg | ORAL_TABLET | Freq: Three times a day (TID) | ORAL | 0 refills | Status: AC

## 2021-01-12 ENCOUNTER — Telehealth: Payer: Self-pay

## 2021-01-12 NOTE — Telephone Encounter (Signed)
Voicemail full, called the pt with her lab results.

## 2021-01-12 NOTE — Telephone Encounter (Signed)
-----   Message from Minette Brine, [place] sent at 01/11/2021 11:48 PM EDT ----- All labs are normal except for Hgb1Ac is 5.9 this is considered prediabetic, limit intake of sugary foods and drinks.

## 2021-01-17 ENCOUNTER — Ambulatory Visit: Payer: BC Managed Care – PPO | Admitting: Nurse Practitioner

## 2021-01-19 ENCOUNTER — Ambulatory Visit: Payer: BC Managed Care – PPO | Admitting: Nurse Practitioner

## 2021-01-19 ENCOUNTER — Encounter: Payer: Self-pay | Admitting: Nurse Practitioner

## 2021-01-19 ENCOUNTER — Other Ambulatory Visit: Payer: Self-pay

## 2021-01-19 VITALS — BP 114/78 | HR 59 | Temp 98.0°F | Ht 66.0 in | Wt 247.6 lb

## 2021-01-19 DIAGNOSIS — G8929 Other chronic pain: Secondary | ICD-10-CM

## 2021-01-19 DIAGNOSIS — M25561 Pain in right knee: Secondary | ICD-10-CM | POA: Diagnosis not present

## 2021-01-19 DIAGNOSIS — F439 Reaction to severe stress, unspecified: Secondary | ICD-10-CM

## 2021-01-19 MED ORDER — DICLOFENAC SODIUM 1 % EX GEL: 2.0000 g | Freq: Four times a day (QID) | CUTANEOUS | 2 refills | Status: DC

## 2021-01-19 MED ORDER — KETOROLAC TROMETHAMINE 60 MG/2ML IM SOLN
60.0000 mg | Freq: Once | INTRAMUSCULAR | Status: AC
  Administered 2021-01-19: 60 mg via INTRAMUSCULAR

## 2021-01-19 NOTE — Progress Notes (Signed)
I,Yamilka Roman [employer] as a Education administrator for [employer], FNP.,have documented all relevant documentation on the behalf of Minette Brine, FNP,as directed by  Minette Brine, FNP while in the presence of Minette Brine, [place]. This visit occurred during the SARS-CoV-2 public health emergency.  Safety protocols were in place, including screening questions prior to the visit, additional usage of staff PPE, and extensive cleaning of exam room while observing appropriate contact time as indicated for disinfecting solutions.  Subjective:     Patient ID: Ashley Burns , female    DOB: August 02, 1961 , 60 y.o.   MRN: [id number]   Chief Complaint  Patient presents with  . Referral    Patient would like a referral to a family counselor     HPI  Patient would like a referral to a family counselor. She also stated she is getting her knee xray done tomorrow and she would like a note for work to be able to work virtually - remote modified shift. Continues to have pain to her knees and is scheduled for her ultrasound and xrays. She stated it is hard to walk while at work because she is having to walk a long distance. She is using pain cream and tylenol arthritis When standing for long periods she has pain as well. She has been having hair shedding, she has been under more stress.     Wt Readings from Last 3 Encounters: 01/19/21 : 247 lb 9.6 oz (112.3 kg) 01/03/21 : 249 lb (112.9 kg) 07/13/20 : 230 lb (104.3 kg)    History reviewed. No pertinent past medical history.   Family History  Problem Relation Age of Onset  . Cancer Mother        breast  . Diabetes Father   . Hypertension Father   . Heart disease Father   . Cancer Cousin        breast     Current Outpatient Medications:  .  diclofenac Sodium (VOLTAREN) 1 % GEL, Apply 2 g topically 4 (four) times daily., Disp: 100 g, Rfl: 2 .  metroNIDAZOLE (FLAGYL) 500 MG tablet, Take 500 mg by mouth 3 (three) times daily., Disp: , Rfl:    No Known  Allergies   Review of Systems  Constitutional: Negative.   Respiratory: Negative.   Cardiovascular: Negative.  Negative for chest pain, palpitations and leg swelling.  Musculoskeletal: Positive for arthralgias.  Neurological: Negative for dizziness and headaches.  Psychiatric/Behavioral: Negative.        She feels she is under increased stress related to work     [employer]   01/19/21 0921  BP: 114/78  Pulse: (!) 59  Temp: 98 F (36.7 C)  TempSrc: Oral  Weight: 247 lb 9.6 oz (112.3 kg)  Height: 5\' 6"  (1.676 m)  PainSc: 5   PainLoc: Knee   Body mass index is 39.96 kg/m.   Objective:  Physical Exam Vitals reviewed.  Constitutional:      General: She is not in acute distress.    Appearance: Normal appearance. She is obese.  Cardiovascular:     Rate and Rhythm: Normal rate and regular rhythm.     Pulses: Normal pulses.     Heart sounds: Normal heart sounds. No murmur heard.   Pulmonary:     Effort: Pulmonary effort is normal. No respiratory distress.     Breath sounds: Normal breath sounds. No wheezing.  Neurological:     General: No focal deficit present.     Mental Status: She is  alert and oriented to person, place, and time.     Cranial Nerves: No cranial nerve deficit.     Motor: No weakness.  Psychiatric:        Mood and Affect: Mood normal.        Behavior: Behavior normal.        Thought Content: Thought content normal.        Judgment: Judgment normal.         Assessment And Plan:     1. Stress  I have referred her to a counselor to help with her stress, she is having shedding of her hair, thyroid levels are normal. - Ambulatory referral to Psychology  2. Chronic pain of right knee  Continues to have pain to knee and she is having her ultrasound and knee xray tomorrow  Pending results will refer to orthopedics - ketorolac (TORADOL) injection 60 mg - diclofenac Sodium (VOLTAREN) 1 % GEL; Apply 2 g topically 4 (four) times daily.  Dispense:  100 g; Refill: 2     Patient was given opportunity to ask questions. Patient verbalized understanding of the plan and was able to repeat key elements of the plan. All questions were answered to their satisfaction.  Minette Brine, FNP   I, Minette Brine, FNP, have reviewed all documentation for this visit. The documentation on 01/19/21 for the exam, diagnosis, procedures, and orders are all accurate and complete.   IF YOU HAVE BEEN REFERRED TO A SPECIALIST, IT MAY TAKE 1-2 WEEKS TO SCHEDULE/PROCESS THE REFERRAL. IF YOU HAVE NOT HEARD FROM US/SPECIALIST IN TWO WEEKS, PLEASE GIVE [place] A CALL AT [phone].   THE PATIENT IS ENCOURAGED TO PRACTICE SOCIAL DISTANCING DUE TO THE COVID-19 PANDEMIC.

## 2021-01-20 ENCOUNTER — Ambulatory Visit
Admission: RE | Admit: 2021-01-20 | Discharge: 2021-01-20 | Disposition: A | Discharge status: Home / Self Care | Payer: Self-pay | Source: Ambulatory Visit | Attending: Nurse Practitioner | Admitting: Nurse Practitioner

## 2021-01-20 ENCOUNTER — Ambulatory Visit
Admission: RE | Admit: 2021-01-20 | Discharge: 2021-01-20 | Disposition: A | Discharge status: Home / Self Care | Payer: BC Managed Care – PPO | Source: Ambulatory Visit | Attending: Nurse Practitioner | Admitting: Nurse Practitioner

## 2021-01-20 DIAGNOSIS — M25562 Pain in left knee: Secondary | ICD-10-CM

## 2021-01-20 DIAGNOSIS — M25561 Pain in right knee: Secondary | ICD-10-CM

## 2021-01-20 DIAGNOSIS — R2241 Localized swelling, mass and lump, right lower limb: Secondary | ICD-10-CM

## 2021-01-31 ENCOUNTER — Telehealth: Payer: Self-pay

## 2021-01-31 ENCOUNTER — Other Ambulatory Visit: Payer: Self-pay

## 2021-01-31 DIAGNOSIS — D1723 Benign lipomatous neoplasm of skin and subcutaneous tissue of right leg: Secondary | ICD-10-CM

## 2021-01-31 NOTE — Telephone Encounter (Signed)
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 This is fatty tissue and is a benign issue. Would you like the referral?  Kindest Regards,   Minette Brine, DNP, FNP-BC  Patient notified she was interested in referral to a surgeon so I have placed the referral. Ashley Burns

## 2021-02-18 ENCOUNTER — Ambulatory Visit
Admission: RE | Admit: 2021-02-18 | Discharge: 2021-02-18 | Disposition: A | Discharge status: Home / Self Care | Payer: Worker's Compensation | Source: Ambulatory Visit | Attending: Nurse Practitioner | Admitting: Nurse Practitioner

## 2021-03-30 ENCOUNTER — Telehealth: Payer: Self-pay | Admitting: *Deleted

## 2021-03-30 ENCOUNTER — Ambulatory Visit (AMBULATORY_SURGERY_CENTER): Payer: BC Managed Care – PPO | Admitting: *Deleted

## 2021-03-30 ENCOUNTER — Other Ambulatory Visit: Payer: Self-pay

## 2021-03-30 VITALS — Ht 66.0 in | Wt 230.0 lb

## 2021-03-30 DIAGNOSIS — Z1211 Encounter for screening for malignant neoplasm of colon: Secondary | ICD-10-CM

## 2021-03-30 MED ORDER — PLENVU 140 G PO SOLR: 1.0000 | Freq: Once | ORAL | 0 refills | Status: AC

## 2021-03-30 NOTE — Progress Notes (Signed)
Pt's previsit is done over the phone and all paperwork (prep instructions, blank consent form to just read over) sent to patient.  Pt's name and DOB verified at the beginning of the previsit.  Pt denies any difficulty with ambulating.    No trouble with anesthesia, denies trouble moving neck   With pt's third c section, pt states, "I had to be under a blanket after surgery d/t my temperature dropping."  Will make Osvaldo Angst CRNA aware of this.  Pt states, "I had my last colonoscopy more than 10 years ago.  I didn't have any polyps then." No FHCC.  Will make Dr. Tarri Glenn aware of this   No egg or soy allergy  No home oxygen use   No medications for weight loss taken  emmi information given  Pt denies constipation issues  Pt informed that we do not do prior authorizations for prep  Plenvu coupon given and code put into RX

## 2021-03-30 NOTE — Telephone Encounter (Signed)
noted 

## 2021-03-30 NOTE — Telephone Encounter (Signed)
Dr. Tarri Glenn and Jenny Reichmann,  I did this pt's PV today.  Two things I would like to pass along.  Dr. Tarri Glenn,  she states she had her last colonoscopy greater than 10 years ago- no polyps and no FHCC.  She doesn't have any record of this, just wanted to let you know.  Also, she states with her third c section, "they had to put me under a special blanket because my temperature kept dropping.  This only happened with my third c section- no issues any other time."  John, please advise.  Thanks, [employer]

## 2021-04-07 ENCOUNTER — Telehealth: Payer: Self-pay | Admitting: Gastroenterology

## 2021-04-08 ENCOUNTER — Telehealth: Payer: Self-pay | Admitting: Gastroenterology

## 2021-04-08 NOTE — Telephone Encounter (Signed)
Attempted pt- no answer- LM to call Back  Changed her to Miralax Gatorade prep- new instructions sent via her My Chart today -   Lelan Pons PV

## 2021-04-08 NOTE — Telephone Encounter (Signed)
Inbound call from patient asking if she can get other prep because Plenvue was $60 and she would like to save money if she can. Procedure is 7//19 with Dr. Tarri Glenn. Best contact number [phone]

## 2021-04-08 NOTE — Telephone Encounter (Signed)
Patient returned call requesting I call pharmacy to explain what we were doing so she could return Plenvu. Pharmacy advised they would refund patient. Patient acknowledged  receipt of new MiraLax instructions and agrees with new prep.

## 2021-04-12 ENCOUNTER — Other Ambulatory Visit: Payer: Self-pay

## 2021-04-12 ENCOUNTER — Ambulatory Visit (AMBULATORY_SURGERY_CENTER): Payer: BC Managed Care – PPO | Admitting: Gastroenterology

## 2021-04-12 ENCOUNTER — Encounter: Payer: Self-pay | Admitting: Gastroenterology

## 2021-04-12 VITALS — BP 128/80 | HR 57 | Temp 97.4°F | Resp 12 | Ht 66.0 in | Wt 230.0 lb

## 2021-04-12 DIAGNOSIS — Z1211 Encounter for screening for malignant neoplasm of colon: Secondary | ICD-10-CM | POA: Diagnosis present

## 2021-04-12 DIAGNOSIS — D123 Benign neoplasm of transverse colon: Secondary | ICD-10-CM | POA: Diagnosis not present

## 2021-04-12 MED ORDER — SODIUM CHLORIDE 0.9 % IV SOLN: 500.0000 mL | INTRAVENOUS | Status: DC

## 2021-04-12 NOTE — Progress Notes (Signed)
Called to room to assist during endoscopic procedure.  Patient ID and intended procedure confirmed with present staff. Received instructions for my participation in the procedure from the performing physician.  

## 2021-04-12 NOTE — Progress Notes (Signed)
Report to PACU, RN, vss, BBS= Clear.  

## 2021-04-12 NOTE — Op Note (Signed)
[facility] Patient Name: Shelisa Fern Procedure Date: 04/12/2021 4:05 PM MRN: [id number] Endoscopist: Thornton Park MD, MD Age: 60 Referring MD:  Date of Birth: December 25, 1960 Gender: Female Account #: [id number] Procedure:                Colonoscopy Indications:              Screening for colorectal malignant neoplasm                           No known family history of colon cancer or polyps                           Prior colonoscopy >10 years ago with Dr. Michail Sermon Medicines:                Monitored Anesthesia Care Procedure:                Pre-Anesthesia Assessment:                           - Prior to the procedure, a History and Physical                            was performed, and patient medications and                            allergies were reviewed. The patient's tolerance of                            previous anesthesia was also reviewed. The risks                            and benefits of the procedure and the sedation                            options and risks were discussed with the patient.                            All questions were answered, and informed consent                            was obtained. Prior Anticoagulants: The patient has                            taken no previous anticoagulant or antiplatelet                            agents. ASA Grade Assessment: II - A patient with                            mild systemic disease. After reviewing the risks                            and benefits, the patient was deemed in  satisfactory condition to undergo the procedure.                           After obtaining informed consent, the colonoscope                            was passed under direct vision. Throughout the                            procedure, the patient's blood pressure, pulse, and                            oxygen saturations were monitored continuously. The                            Olympus  CF-HQ190L ([id number]) Colonoscope was                            introduced through the anus and advanced to the 3                            cm into the ileum. The colonoscopy was performed                            without difficulty. The patient tolerated the                            procedure well. The quality of the bowel                            preparation was good. The terminal ileum, ileocecal                            valve, appendiceal orifice, and rectum were                            photographed. Scope In: 4:12:17 PM Scope Out: 4:28:16 PM Scope Withdrawal Time: 0 hours 12 minutes 55 seconds  Total Procedure Duration: 0 hours 15 minutes 59 seconds  Findings:                 Skin tags were found on perianal exam.                           A 2 mm polyp was found in the proximal transverse                            colon. The polyp was sessile. The polyp was removed                            with a cold snare. Resection and retrieval were                            complete. Estimated blood loss was minimal.  Multiple small and large-mouthed diverticula were                            found in the sigmoid colon, descending colon,                            transverse colon and ascending colon. Complications:            No immediate complications. Estimated blood loss:                            Minimal. Estimated Blood Loss:     Estimated blood loss was minimal. Impression:               - Perianal skin tags found on perianal exam.                           - One 2 mm polyp in the proximal transverse colon,                            removed with a cold snare. Resected and retrieved.                           - The examination was otherwise normal on direct                            and retroflexion views. Recommendation:           - Patient has a contact number available for                            emergencies. The signs and symptoms of  potential                            delayed complications were discussed with the                            patient. Return to normal activities tomorrow.                            Written discharge instructions were provided to the                            patient.                           - Resume previous diet. High fiber diet recommended.                           - Continue present medications.                           - Await pathology results.                           - Repeat colonoscopy date to be determined after  pending pathology results are reviewed for                            surveillance.                           - Emerging evidence supports eating a diet of                            fruits, vegetables, grains, calcium, and yogurt                            while reducing red meat and alcohol may reduce the                            risk of colon cancer.                           - Thank you for allowing me to be involved in your                            colon cancer prevention. Thornton Park MD, MD 04/12/2021 4:33:48 PM This report has been signed electronically.

## 2021-04-12 NOTE — Patient Instructions (Signed)
YOU HAD AN ENDOSCOPIC PROCEDURE TODAY AT THE [facility]:   Refer to the procedure report that was given to you for any specific questions about what was found during the examination.  If the procedure report does not answer your questions, please call your gastroenterologist to clarify.  If you requested that your care partner not be given the details of your procedure findings, then the procedure report has been included in a sealed envelope for you to review at your convenience later.  YOU SHOULD EXPECT: Some feelings of bloating in the abdomen. Passage of more gas than usual.  Walking can help get rid of the air that was put into your GI tract during the procedure and reduce the bloating. If you had a lower endoscopy (such as a colonoscopy or flexible sigmoidoscopy) you may notice spotting of blood in your stool or on the toilet paper. If you underwent a bowel prep for your procedure, you may not have a normal bowel movement for a few days.  Please Note:  You might notice some irritation and congestion in your nose or some drainage.  This is from the oxygen used during your procedure.  There is no need for concern and it should clear up in a day or so.  SYMPTOMS TO REPORT IMMEDIATELY:   Following lower endoscopy (colonoscopy or flexible sigmoidoscopy):  Excessive amounts of blood in the stool  Significant tenderness or worsening of abdominal pains  Swelling of the abdomen that is new, acute  Fever of 100F or higher   Following upper endoscopy (EGD)  Vomiting of blood or coffee ground material  New chest pain or pain under the shoulder blades  Painful or persistently difficult swallowing  New shortness of breath  Fever of 100F or higher  Black, tarry-looking stools  For urgent or emergent issues, a gastroenterologist can be reached at any hour by calling [phone]. Do not use MyChart messaging for urgent concerns.    DIET:  We do recommend a small meal at first, but  then you may proceed to your regular diet.  Drink plenty of fluids but you should avoid alcoholic beverages for 24 hours.  ACTIVITY:  You should plan to take it easy for the rest of today and you should NOT DRIVE or use heavy machinery until tomorrow (because of the sedation medicines used during the test).    FOLLOW UP: Our staff will call the number listed on your records 48-72 hours following your procedure to check on you and address any questions or concerns that you may have regarding the information given to you following your procedure. If we do not reach you, we will leave a message.  We will attempt to reach you two times.  During this call, we will ask if you have developed any symptoms of COVID 19. If you develop any symptoms (ie: fever, flu-like symptoms, shortness of breath, cough etc.) before then, please call [phone].  If you test positive for Covid 19 in the 2 weeks post procedure, please call and report this information to us.    If any biopsies were taken you will be contacted by phone or by letter within the next 1-3 weeks.  Please call us at [phone] if you have not heard about the biopsies in 3 weeks.    SIGNATURES/CONFIDENTIALITY: You and/or your care partner have signed paperwork which will be entered into your electronic medical record.  These signatures attest to the fact that that the information above on   your After Visit Summary has been reviewed and is understood.  Full responsibility of the confidentiality of this discharge information lies with you and/or your care-partner. 

## 2021-04-18 ENCOUNTER — Encounter: Payer: Self-pay | Admitting: Gastroenterology

## 2021-04-24 ENCOUNTER — Encounter: Payer: Self-pay | Admitting: Nurse Practitioner

## 2021-04-25 ENCOUNTER — Ambulatory Visit: Payer: BC Managed Care – PPO | Admitting: Nurse Practitioner

## 2021-04-25 HISTORY — PX: COLONOSCOPY: SHX174

## 2021-05-12 ENCOUNTER — Other Ambulatory Visit: Payer: Self-pay

## 2021-05-12 ENCOUNTER — Encounter: Payer: Self-pay | Admitting: Internal Medicine

## 2021-05-12 ENCOUNTER — Ambulatory Visit: Payer: BC Managed Care – PPO | Admitting: Internal Medicine

## 2021-05-12 VITALS — BP 122/64 | HR 52 | Temp 98.5°F | Ht 66.0 in | Wt 241.4 lb

## 2021-05-12 DIAGNOSIS — R7309 Other abnormal glucose: Secondary | ICD-10-CM | POA: Diagnosis not present

## 2021-05-12 DIAGNOSIS — M1711 Unilateral primary osteoarthritis, right knee: Secondary | ICD-10-CM | POA: Diagnosis not present

## 2021-05-12 DIAGNOSIS — E6609 Other obesity due to excess calories: Secondary | ICD-10-CM | POA: Diagnosis not present

## 2021-05-12 DIAGNOSIS — G8929 Other chronic pain: Secondary | ICD-10-CM

## 2021-05-12 DIAGNOSIS — Z23 Encounter for immunization: Secondary | ICD-10-CM

## 2021-05-12 DIAGNOSIS — Z6838 Body mass index (BMI) 38.0-38.9, adult: Secondary | ICD-10-CM

## 2021-05-12 MED ORDER — TETANUS-DIPHTH-ACELL PERTUSSIS 5-2.5-18.5 LF-MCG/0.5 IM SUSY
0.5000 mL | PREFILLED_SYRINGE | Freq: Once | INTRAMUSCULAR | Status: AC
  Administered 2021-05-12: 0.5 mL via INTRAMUSCULAR

## 2021-05-12 NOTE — Progress Notes (Signed)
I,Tianna Badgett,acting as a Education administrator for Maximino Greenland, MD.,have documented all relevant documentation on the behalf of Maximino Greenland, MD,as directed by  Maximino Greenland, MD while in the presence of Maximino Greenland, MD.  This visit occurred during the SARS-CoV-2 public health emergency.  Safety protocols were in place, including screening questions prior to the visit, additional usage of staff PPE, and extensive cleaning of exam room while observing appropriate contact time as indicated for disinfecting solutions.  Subjective:     Patient ID: Ashley Burns , female    DOB: [birth year] , 60 y.o.   MRN: [id number]   Chief Complaint  Patient presents with  . Knee Pain    HPI  She presents today for further evaluation of knee pain. Denies fall/trauma. Has yet to be seen by [facility].   Knee Pain  The incident occurred more than 1 week ago. There was no injury mechanism. The pain is present in the right knee. The quality of the pain is described as aching. The pain is at a severity of 5/10. The pain is moderate. The pain has been Fluctuating since onset.    Past Medical History:  Diagnosis Date  . Arthritis   . Bundle branch block   . Hypertension    with pregnancy only     Family History  Problem Relation Age of Onset  . Cancer Mother        breast  . Diabetes Father   . Hypertension Father   . Heart disease Father   . Cancer Cousin        breast  . Colon cancer Neg Hx   . Esophageal cancer Neg Hx   . Stomach cancer Neg Hx   . Rectal cancer Neg Hx      Current Outpatient Medications:  .  BIOTIN PO, Take by mouth., Disp: , Rfl:  .  CALCIUM PO, Take by mouth. Every other day, Disp: , Rfl:  .  Cholecalciferol (VITAMIN D3 PO), Take by mouth daily., Disp: , Rfl:  .  Cyanocobalamin (VITAMIN B-12 PO), Take by mouth daily., Disp: , Rfl:  .  diclofenac Sodium (VOLTAREN) 1 % GEL, Apply 2 g topically 4 (four) times daily., Disp: 100 g, Rfl: 2 .  Ferrous Sulfate (IRON PO), Take by  mouth daily., Disp: , Rfl:  .  Multiple Vitamins-Minerals (ZINC PO), Take by mouth daily., Disp: , Rfl:  .  TURMERIC PO, Take by mouth daily., Disp: , Rfl:    Allergies  Allergen Reactions  . Shellfish Allergy     itching     Review of Systems  Constitutional: Negative.   Respiratory: Negative.    Cardiovascular: Negative.   Gastrointestinal: Negative.   Musculoskeletal:  Positive for arthralgias.  Neurological: Negative.     Today's Vitals   05/12/21 0937  BP: 122/64  Pulse: (!) 52  Temp: 98.5 F (36.9 C)  TempSrc: Oral  Weight: 241 lb 6.4 oz (109.5 kg)  Height: '5\' 6"'$  (1.676 m)   Body mass index is 38.96 kg/m.  Wt Readings from Last 3 Encounters:  05/12/21 241 lb 6.4 oz (109.5 kg)  04/12/21 230 lb (104.3 kg)  03/30/21 230 lb (104.3 kg)    Objective:  Physical Exam Vitals and nursing note reviewed.  Constitutional:      Appearance: Normal appearance.  HENT:     Head: Normocephalic and atraumatic.     Nose:     Comments: Masked     Mouth/Throat:  Comments: Masked  Eyes:     Extraocular Movements: Extraocular movements intact.  Cardiovascular:     Rate and Rhythm: Normal rate and regular rhythm.     Heart sounds: Normal heart sounds.  Pulmonary:     Effort: Pulmonary effort is normal.     Breath sounds: Normal breath sounds.  Musculoskeletal:        General: Swelling and tenderness present.     Cervical back: Normal range of motion.  Skin:    General: Skin is warm.  Neurological:     General: No focal deficit present.     Mental Status: She is alert.  Psychiatric:        Mood and Affect: Mood normal.        Behavior: Behavior normal.        Assessment And Plan:     1. Chronic pain of right knee Comments: She agrees to Ortho referral for further evaluation and radiographic studies.  - Ambulatory referral to Orthopedic Surgery  2. Other abnormal glucose Comments: Her a1c has been elevated in the past . I will recheck labs as listed below. She  is encouraged to limit intake of sweetened beverages, including diet.  - Hemoglobin A1c - Insulin, random(561)  3. Class 2 obesity due to excess calories without serious comorbidity with body mass index (BMI) of 38.0 to 38.9 in adult Comments: She states she has not gained 11 lbs. States weight entered incorrectly at another facility.   4. Immunization due - Tdap (BOOSTRIX) injection 0.5 mL    Patient was given opportunity to ask questions. Patient verbalized understanding of the plan and was able to repeat key elements of the plan. All questions were answered to their satisfaction.   I, Maximino Greenland, MD, have reviewed all documentation for this visit. The documentation on 05/16/21 for the exam, diagnosis, procedures, and orders are all accurate and complete.   IF YOU HAVE BEEN REFERRED TO A SPECIALIST, IT MAY TAKE 1-2 WEEKS TO SCHEDULE/PROCESS THE REFERRAL. IF YOU HAVE NOT HEARD FROM US/SPECIALIST IN TWO WEEKS, PLEASE GIVE [place] A CALL AT [phone].   THE PATIENT IS ENCOURAGED TO PRACTICE SOCIAL DISTANCING DUE TO THE COVID-19 PANDEMIC.

## 2021-05-12 NOTE — Patient Instructions (Signed)
Preventing Type 2 Diabetes Mellitus Type 2 diabetes, also called type 2 diabetes mellitus, is a long-term (chronic) disease that affects sugar (glucose) levels in your blood. Normally, a hormone called insulin allows glucose to enter cells in your body. The cells use glucose for energy. With type 2 diabetes, you will have one or both of these problems: Your pancreas does not make enough insulin. Cells in your body do not respond properly to insulin that your body makes (insulin resistance). Insulin resistance or lack of insulin causes extra glucose to build up in the blood instead of going into cells. As a result, high blood glucose (hyperglycemia) develops. That can cause many complications. Being overweight or obese and having an inactive (sedentary) lifestyle can increase your risk for diabetes. Type 2 diabetes can be delayedor prevented by making certain nutrition and lifestyle changes. How can this condition affect me? If you do not take steps to prevent diabetes, your blood glucose levels may keep increasing over time. Too much glucose in your blood for a long time candamage your blood vessels, heart, kidneys, nerves, and eyes. Type 2 diabetes can lead to chronic health problems and complications, such as: Heart disease. Stroke. Blindness. Kidney disease. Depression. Poor circulation in your feet and legs. In severe cases, a foot or leg may need to be surgically removed (amputated). What can increase my risk? You may be more likely to develop type 2 diabetes if you: Have type 2 diabetes in your family. Are overweight or obese. Have a sedentary lifestyle. Have insulin resistance or a history of prediabetes. Have a history of pregnancy-related (gestational) diabetes or polycystic ovary syndrome (PCOS). What actions can I take to prevent this? It can be difficult to recognize signs of type 2 diabetes. Taking action to prevent the disease before you develop symptoms is the best way to avoid  possible damage to your body. Making certain nutrition and lifestyle changesmay prevent or delay the disease and related health problems. Nutrition  Eat healthy meals and snacks regularly. Do not skip meals. Fruit or a handful of nuts is a healthy snack between meals. Drink water throughout the day. Avoid drinks that contain added sugar, such as soda or sweetened tea. Drink enough fluid to keep your urine pale yellow. Follow instructions from your health care provider about eating or drinking restrictions. Limit the amount of food you eat by: Controlling how much you eat at a time (portion size). Checking food labels for the serving sizes of food. Using a kitchen scale to weigh amounts of food. Saut or steam food instead of frying it. Cook with water or broth instead of oils or butter. Limit saturated fat and salt (sodium) in your diet. Have no more than 1 tsp (2,400 mg) of sodium a day. If you have heart disease or high blood pressure, use less than ? tsp (1,500 mg) of sodium a day.  Lifestyle  Lose weight if needed and as told. Your health care provider can determine how much weight loss is best for you and can help you lose weight safely. If you are overweight or obese, you may be told to lose at least 5?7% of your body weight. Manage blood pressure, cholesterol, and stress. Your health care provider will help determine the best treatment for you. Do not use any products that contain nicotine or tobacco, such as cigarettes, e-cigarettes, and chewing tobacco. If you need help quitting, ask your health care provider.  Activity  Do physical activity that makes your heart beat  faster and makes you sweat (moderate intensity). Do this for at least 30 minutes on at least 5 days of the week, or as much as told by your health care provider. Ask your health care provider what activities are safe for you. A mix of activities may be best, such as walking, swimming, cycling, and strength  training. Try to add physical activity into your day. For example: Park your car farther away than usual so that you walk more. Take a walk during your lunch break. Use stairs instead of elevators or escalators. Walk or bike to work instead of driving.  Alcohol use If you drink alcohol: Limit how much you use to: 0?1 drink a day for women who are not pregnant. 0?2 drinks a day for men. Be aware of how much alcohol is in your drink. In the [place] one drink equals one 12 oz bottle of beer (355 mL), one 5 oz glass of wine (148 mL), or one 1 oz glass of hard liquor (44 mL). General information Talk with your health care provider about your risk factors and how you can reduce your risk for diabetes. Have your blood glucose tested regularly, as told by your health care provider. Get screening tests as told by your health care provider. You may have these regularly, especially if you have certain risk factors for type 2 diabetes. Make an appointment with a registered dietitian. This diet and nutrition specialist can help you make a healthy eating plan and help you understand portion sizes and food labels. Where to find support Ask your health care provider to recommend a registered dietitian, a certified diabetes care and education specialist, or a weight loss program. Look for local or online weight loss groups. Join a gym, fitness club, or outdoor activity group, such as a walking club. Where to find more information To learn more about diabetes and diabetes prevention, visit: American Diabetes Association (ADA): www.diabetes.[employer] of Diabetes and Digestive and Kidney Diseases: [employer].dk To learn more about healthy eating, visit: U.S. Department of Agriculture Scientist, research (physical sciences)): FormerBoss.no Office of Disease Prevention and Health Promotion (ODPHP): [place] Summary You can delay or prevent type 2 diabetes by eating healthy foods, losing weight if needed, and  increasing your physical activity. Talk with your health care provider about your risk factors for type 2 diabetes and how you can reduce your risk. It can be difficult to recognize the signs of type 2 diabetes. The best way to avoid possible damage to your body is to take action to prevent the disease before you develop symptoms. Get screening tests as told by your health care provider. This information is not intended to replace advice given to you by your health care provider. Make sure you discuss any questions you have with your healthcare provider. Document Revised: 08/25/2020 Document Reviewed: 04/07/2020 Elsevier Patient Education  [place].

## 2021-05-13 ENCOUNTER — Other Ambulatory Visit: Payer: Self-pay | Admitting: Surgery

## 2021-05-13 LAB — INSULIN, RANDOM: INSULIN: 12.7 u[IU]/mL (ref 2.6–24.9)

## 2021-05-13 LAB — HEMOGLOBIN A1C
Est. average glucose Bld gHb Est-mCnc: 117 mg/dL
Hgb A1c MFr Bld: 5.7 % — ABNORMAL HIGH (ref 4.8–5.6)

## 2021-05-17 ENCOUNTER — Encounter: Payer: Self-pay | Admitting: Internal Medicine

## 2021-05-24 ENCOUNTER — Other Ambulatory Visit: Payer: Self-pay

## 2021-05-25 NOTE — H&P (Signed)
PROVIDER:  Beverlee Nims, MD   MRN: [id number] DOB: 10/08/1960 DATE OF ENCOUNTER: 05/13/2021   Subjective    Chief Complaint: Lipoma       History of Present Illness: Ashley Burns is a 60 y.o. female who is seen today as an office consultation at the request of Dr. Pasty Arch for evaluation of Lipoma .     This is a pleasant 60 year old female referred here for evaluation of a right thigh mass.  She reports that has been present for at least 8 months and is now causing increasing discomfort when she ambulates.  She underwent an ultrasound of the mass earlier this year showing an 8.6 x 8.7 x 4 point 2 subcutaneous mass on the right proximal thigh near the hip.  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  Again, she is having increasing discomfort from the mass especially with ambulation.  She is otherwise healthy without complaints.  She has no cardiopulmonary issues     Review of Systems: A complete review of systems was obtained from the patient.  I have reviewed this information and discussed as appropriate with the patient.  See HPI as well for other ROS.   Review of Systems  All other systems reviewed and are negative.       Medical History: Past Medical History      Past Medical History:  Diagnosis Date   Arthritis          There is no problem list on file for this patient.     Past Surgical History       Past Surgical History:  Procedure Laterality Date   CESAREAN SECTION        two times        Allergies       Allergies  Allergen Reactions   Shellfish Containing Products Other (See Comments)      itching              Current Outpatient Medications on File Prior to Visit  Medication Sig Dispense Refill   calcium carbonate (TUMS E-X) 300 mg (750 mg) chewable tablet Take 300 mg of elemental by mouth every 2 (two) hours as needed for Heartburn       cholecalciferol, vitamin D3, (CHOLECALCIFEROL, VIT D3,,BULK,) 100,000 unit/gram Powd Take by mouth  once daily       multivit-min-ferrous sulfate 4.5 mg iron PwPk Take by mouth once daily       turmeric, bulk, 100 % Powd Take by mouth once daily       zinc citrate-phytase (ZYTAZE) 25-500 mg capsule Take by mouth       biotin 100 mg/gram Powd Take by mouth        No current facility-administered medications on file prior to visit.      Family History       Family History  Problem Relation Age of Onset   Skin cancer Mother     Breast cancer Mother     Obesity Father     Obesity Sister     High blood pressure (Hypertension) Sister     Hyperlipidemia (Elevated cholesterol) Sister     Diabetes Sister     Deep vein thrombosis (DVT or abnormal blood clot formation) Sister     Obesity Brother     High blood pressure (Hypertension) Brother     Hyperlipidemia (Elevated cholesterol) Brother     Diabetes Brother          Social History  Tobacco Use  Smoking Status Never Smoker  Smokeless Tobacco Never Used      Social History  Social History        Socioeconomic History   Marital status: Married  Tobacco Use   Smoking status: Never Smoker   Smokeless tobacco: Never Used  Substance and Sexual Activity   Alcohol use: Never   Drug use: Never        Objective:         Vitals:    05/13/21 1045  BP: 136/80  Pulse: 82  Temp: 36.6 C (97.8 F)  SpO2: 97%  Weight: (!) 110 kg (242 lb 6.4 oz)  Height: 167.6 cm ('5\' 6"'$ )    Body mass index is 39.12 kg/m.   Physical Exam    She appears well on exam   There is a large soft mass on the right medial thigh near the hip.  It is minimally mobile.  There are no skin changes.  It is nontender.  It measures at least 8 cm.   Lungs clear  CV RRR   Labs, Imaging and Diagnostic Testing: I have reviewed the ultrasound of the thigh mass   Assessment and Plan:  Diagnoses and all orders for this visit:   Mass of thigh, right       I have reviewed her notes in the electronic medical records.  I have reviewed her x-ray  data.  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  Because of its large size, the discomfort is causing, and for complete histologic evaluation, excision of this mass is recommended and the patient is eager to proceed with excision.  I discussed the surgical procedure with her in detail.  We discussed the risk which includes but is not limited to bleeding, infection, injury to surrounding structures, postoperative seroma formation, recurrence, the need for further procedures if malignancy is found, cardiopulmonary issues, postoperative recovery, etc.  She understands and wishes to proceed with surgery which will be scheduled.   No follow-ups on file.   Beverlee Nims, MD

## 2021-05-26 ENCOUNTER — Ambulatory Visit (HOSPITAL_COMMUNITY): Payer: BC Managed Care – PPO | Admitting: Certified Registered Nurse Anesthetist

## 2021-05-26 ENCOUNTER — Encounter (HOSPITAL_COMMUNITY): Payer: Self-pay | Admitting: Surgery

## 2021-05-26 ENCOUNTER — Encounter (HOSPITAL_COMMUNITY)
Admission: RE | Disposition: A | Discharge status: Home / Self Care | Payer: Self-pay | Source: Home / Self Care | Attending: Surgery

## 2021-05-26 ENCOUNTER — Ambulatory Visit (HOSPITAL_COMMUNITY)
Admission: RE | Admit: 2021-05-26 | Discharge: 2021-05-26 | Disposition: A | Discharge status: Home / Self Care | Payer: BC Managed Care – PPO | Attending: Surgery | Admitting: Surgery

## 2021-05-26 DIAGNOSIS — Z98891 History of uterine scar from previous surgery: Secondary | ICD-10-CM | POA: Insufficient documentation

## 2021-05-26 DIAGNOSIS — D1723 Benign lipomatous neoplasm of skin and subcutaneous tissue of right leg: Secondary | ICD-10-CM | POA: Diagnosis not present

## 2021-05-26 DIAGNOSIS — R2241 Localized swelling, mass and lump, right lower limb: Secondary | ICD-10-CM | POA: Diagnosis present

## 2021-05-26 HISTORY — PX: EXCISION MASS LOWER EXTREMETIES: SHX6705

## 2021-05-26 LAB — POCT I-STAT, CHEM 8
BUN: 13 mg/dL (ref 6–20)
Calcium, Ion: 1.18 mmol/L (ref 1.15–1.40)
Chloride: 106 mmol/L (ref 98–111)
Creatinine, Ser: 0.6 mg/dL (ref 0.44–1.00)
Glucose, Bld: 89 mg/dL (ref 70–99)
HCT: 43 % (ref 36.0–46.0)
Hemoglobin: 14.6 g/dL (ref 12.0–15.0)
Potassium: 4.2 mmol/L (ref 3.5–5.1)
Sodium: 141 mmol/L (ref 135–145)
TCO2: 25 mmol/L (ref 22–32)

## 2021-05-26 SURGERY — EXCISION MASS LOWER EXTREMITIES
Anesthesia: Monitor Anesthesia Care | Site: Thigh | Laterality: Right

## 2021-05-26 MED ORDER — ONDANSETRON HCL 4 MG/2ML IJ SOLN
4.0000 mg | Freq: Once | INTRAMUSCULAR | Status: DC | PRN
Start: 2021-05-26 — End: 2021-05-26

## 2021-05-26 MED ORDER — OXYCODONE HCL 5 MG PO TABS: 5.0000 mg | ORAL_TABLET | Freq: Once | ORAL | Status: DC | PRN

## 2021-05-26 MED ORDER — OXYCODONE HCL 5 MG PO TABS: 5.0000 mg | ORAL_TABLET | Freq: Four times a day (QID) | ORAL | 0 refills | Status: DC | PRN

## 2021-05-26 MED ORDER — ACETAMINOPHEN 500 MG PO TABS: 1000.0000 mg | ORAL_TABLET | ORAL | Status: AC

## 2021-05-26 MED ORDER — KETOROLAC TROMETHAMINE 30 MG/ML IJ SOLN
INTRAMUSCULAR | Status: DC | PRN
  Administered 2021-05-26: 30 mg via INTRAVENOUS

## 2021-05-26 MED ORDER — CHLORHEXIDINE GLUCONATE CLOTH 2 % EX PADS
6.0000 | MEDICATED_PAD | Freq: Once | CUTANEOUS | Status: AC
  Administered 2021-05-26: 6 via TOPICAL

## 2021-05-26 MED ORDER — ONDANSETRON HCL 4 MG/2ML IJ SOLN
INTRAMUSCULAR | Status: AC
  Filled 2021-05-26: qty 2

## 2021-05-26 MED ORDER — PROPOFOL 1000 MG/100ML IV EMUL
INTRAVENOUS | Status: AC
  Filled 2021-05-26: qty 100

## 2021-05-26 MED ORDER — KETOROLAC TROMETHAMINE 30 MG/ML IJ SOLN
INTRAMUSCULAR | Status: AC
  Filled 2021-05-26: qty 1

## 2021-05-26 MED ORDER — BUPIVACAINE HCL (PF) 0.25 % IJ SOLN
INTRAMUSCULAR | Status: DC | PRN
  Administered 2021-05-26: 30 mL

## 2021-05-26 MED ORDER — CHLORHEXIDINE GLUCONATE 0.12 % MT SOLN: 15.0000 mL | Freq: Once | OROMUCOSAL | Status: AC

## 2021-05-26 MED ORDER — LACTATED RINGERS IV SOLN: INTRAVENOUS | Status: DC

## 2021-05-26 MED ORDER — ACETAMINOPHEN 500 MG PO TABS
ORAL_TABLET | ORAL | Status: AC
  Administered 2021-05-26: 1000 mg via ORAL
  Filled 2021-05-26: qty 2

## 2021-05-26 MED ORDER — FENTANYL CITRATE (PF) 100 MCG/2ML IJ SOLN: 25.0000 ug | INTRAMUSCULAR | Status: DC | PRN

## 2021-05-26 MED ORDER — LIDOCAINE HCL (PF) 1 % IJ SOLN
INTRAMUSCULAR | Status: DC | PRN
  Administered 2021-05-26: 20 mL

## 2021-05-26 MED ORDER — LIDOCAINE HCL (PF) 1 % IJ SOLN
INTRAMUSCULAR | Status: AC
  Filled 2021-05-26: qty 30

## 2021-05-26 MED ORDER — 0.9 % SODIUM CHLORIDE (POUR BTL) OPTIME
TOPICAL | Status: DC | PRN
  Administered 2021-05-26: 1000 mL

## 2021-05-26 MED ORDER — CEFAZOLIN SODIUM-DEXTROSE 2-4 GM/100ML-% IV SOLN
2.0000 g | INTRAVENOUS | Status: AC
  Administered 2021-05-26: 2 g via INTRAVENOUS

## 2021-05-26 MED ORDER — CHLORHEXIDINE GLUCONATE 0.12 % MT SOLN
OROMUCOSAL | Status: AC
  Administered 2021-05-26: 15 mL via OROMUCOSAL
  Filled 2021-05-26: qty 15

## 2021-05-26 MED ORDER — BUPIVACAINE HCL (PF) 0.25 % IJ SOLN
INTRAMUSCULAR | Status: AC
  Filled 2021-05-26: qty 30

## 2021-05-26 MED ORDER — PROPOFOL 500 MG/50ML IV EMUL
INTRAVENOUS | Status: DC | PRN
  Administered 2021-05-26: 75 ug/kg/min via INTRAVENOUS

## 2021-05-26 MED ORDER — CHLORHEXIDINE GLUCONATE CLOTH 2 % EX PADS: 6.0000 | MEDICATED_PAD | Freq: Once | CUTANEOUS | Status: DC

## 2021-05-26 MED ORDER — MIDAZOLAM HCL 5 MG/5ML IJ SOLN
INTRAMUSCULAR | Status: DC | PRN
Start: 2021-05-26 — End: 2021-05-26
  Administered 2021-05-26: 2 mg via INTRAVENOUS

## 2021-05-26 MED ORDER — ONDANSETRON HCL 4 MG/2ML IJ SOLN
INTRAMUSCULAR | Status: DC | PRN
  Administered 2021-05-26: 4 mg via INTRAVENOUS

## 2021-05-26 MED ORDER — CEFAZOLIN SODIUM-DEXTROSE 2-4 GM/100ML-% IV SOLN
INTRAVENOUS | Status: AC
  Filled 2021-05-26: qty 100

## 2021-05-26 MED ORDER — MIDAZOLAM HCL 2 MG/2ML IJ SOLN
INTRAMUSCULAR | Status: AC
  Filled 2021-05-26: qty 2

## 2021-05-26 MED ORDER — KETAMINE HCL 50 MG/5ML IJ SOSY
PREFILLED_SYRINGE | INTRAMUSCULAR | Status: AC
  Filled 2021-05-26: qty 5

## 2021-05-26 MED ORDER — DEXAMETHASONE SODIUM PHOSPHATE 10 MG/ML IJ SOLN
INTRAMUSCULAR | Status: AC
  Filled 2021-05-26: qty 1

## 2021-05-26 MED ORDER — OXYCODONE HCL 5 MG/5ML PO SOLN: 5.0000 mg | Freq: Once | ORAL | Status: DC | PRN

## 2021-05-26 MED ORDER — ORAL CARE MOUTH RINSE: 15.0000 mL | Freq: Once | OROMUCOSAL | Status: AC

## 2021-05-26 SURGICAL SUPPLY — 30 items
BAG COUNTER SPONGE SURGICOUNT (BAG) ×2 IMPLANT
CANISTER SUCT 3000ML PPV (MISCELLANEOUS) IMPLANT
COVER SURGICAL LIGHT HANDLE (MISCELLANEOUS) ×2 IMPLANT
DERMABOND ADVANCED (GAUZE/BANDAGES/DRESSINGS) ×1
DERMABOND ADVANCED .7 DNX12 (GAUZE/BANDAGES/DRESSINGS) ×1 IMPLANT
DRAPE LAPAROSCOPIC ABDOMINAL (DRAPES) IMPLANT
DRAPE LAPAROTOMY 100X72 PEDS (DRAPES) IMPLANT
DRSG TEGADERM 4X4.75 (GAUZE/BANDAGES/DRESSINGS) ×2 IMPLANT
ELECT REM PT RETURN 9FT ADLT (ELECTROSURGICAL) ×2
ELECTRODE REM PT RTRN 9FT ADLT (ELECTROSURGICAL) ×1 IMPLANT
GAUZE SPONGE 4X4 12PLY STRL (GAUZE/BANDAGES/DRESSINGS) ×2 IMPLANT
GLOVE SURG SIGNA 7.5 PF LTX (GLOVE) ×2 IMPLANT
GOWN STRL REUS W/ TWL LRG LVL3 (GOWN DISPOSABLE) ×1 IMPLANT
GOWN STRL REUS W/ TWL XL LVL3 (GOWN DISPOSABLE) ×1 IMPLANT
GOWN STRL REUS W/TWL LRG LVL3 (GOWN DISPOSABLE) ×2
GOWN STRL REUS W/TWL XL LVL3 (GOWN DISPOSABLE) ×2
KIT BASIN OR (CUSTOM PROCEDURE TRAY) ×2 IMPLANT
KIT TURNOVER KIT B (KITS) ×2 IMPLANT
NEEDLE HYPO 25GX1X1/2 BEV (NEEDLE) ×2 IMPLANT
NS IRRIG 1000ML POUR BTL (IV SOLUTION) ×2 IMPLANT
PACK GENERAL/GYN (CUSTOM PROCEDURE TRAY) ×2 IMPLANT
PAD ARMBOARD 7.5X6 YLW CONV (MISCELLANEOUS) ×2 IMPLANT
PENCIL SMOKE EVACUATOR (MISCELLANEOUS) ×2 IMPLANT
SPECIMEN JAR SMALL (MISCELLANEOUS) ×2 IMPLANT
SUT MNCRL AB 4-0 PS2 18 (SUTURE) ×2 IMPLANT
SUT VIC AB 3-0 SH 27 (SUTURE) ×2
SUT VIC AB 3-0 SH 27XBRD (SUTURE) ×1 IMPLANT
SYR CONTROL 10ML LL (SYRINGE) ×2 IMPLANT
TOWEL GREEN STERILE (TOWEL DISPOSABLE) ×2 IMPLANT
TOWEL GREEN STERILE FF (TOWEL DISPOSABLE) ×2 IMPLANT

## 2021-05-26 NOTE — Transfer of Care (Signed)
Immediate Anesthesia Transfer of Care Note  Patient: Ashley Burns  Procedure(s) Performed: EXCISION RIGHT THIGH MASS (Right: Thigh)  Patient Location: PACU  Anesthesia Type:MAC  Level of Consciousness: awake, alert , oriented and patient cooperative  Airway & Oxygen Therapy: Patient Spontanous Breathing  Post-op Assessment: Report given to RN, Post -op Vital signs reviewed and stable and Patient moving all extremities X 4  Post vital signs: stable  Last Vitals:  Vitals Value Taken Time  BP 102/52 05/26/21 1624  Temp 36.6 C 05/26/21 1624  Pulse 55 05/26/21 1627  Resp 14 05/26/21 1627  SpO2 100 % 05/26/21 1627  Vitals shown include unvalidated device data.  Last Pain:  Vitals:   05/26/21 1624  TempSrc:   PainSc: 0-No pain      Patients Stated Pain Goal: 2 (73/71/06 2694)  Complications: No notable events documented.

## 2021-05-26 NOTE — Telephone Encounter (Signed)
Returned call to patient to answer her questions. I recommend she skip this week for her lipo B injection due to having her lipoma removed. Also will have referral specialist to contact [facility] to inform them the patient was referred for her mass to her leg from this office, she is concerned the note from the surgeon says self referral and hoping there will not be an issue with her insurance covering the procedure and visits. Also, reassured her the mass will be removed from her leg is consistent with a lipoma according to the surgery consultation note

## 2021-05-26 NOTE — Op Note (Signed)
EXCISION RIGHT THIGH MASS  Procedure Note  Ashley Burns 05/26/2021   Pre-op Diagnosis: RIGHT THIGH MASS     Post-op Diagnosis: same  Procedure(s): EXCISION RIGHT THIGH MASS  Surgeon(s): Coralie Keens, MD Drucie Ip, MD  Anesthesia: Monitor Anesthesia Care  Staff:  Circulator: Rozell Searing, RN Scrub Person: Celene Squibb, RN Circulator Assistant: Kandra Nicolas, RN  Procedure Details: The procedure, risks and complications have been discussed in detail (including, infection, bleeding, need for additional procedures) with the patient, and the patient has signed consent to the procedure.The right thigh was marked appropriately in the pre-operative area. The patient was taken to OR and placed under MAC anesthesia. The patient was placed into supine position.  The skin was sterilely prepped and draped over the affected area in the usual fashion. Time out was performed according to the surgical safety checklist.   A 4cm incision was made on the right lateral thigh I&D with a #15 blade. Electrocautery and blunt dissection were used to dissect down to the right thigh mass which was excised and sent to pathology. The specimen measured 8cm and had characteristics consistent with a lipoma. The wound was explored and hemostasis was achieved. 3-0 vicryl sutures were placed deep in the wound and also placed in the deep dermal space. A running 4-0 biosyn was used to close the skin and dermabond was placed above the incision. All counts and correct. The patient was awakened from anesthesia and taken to the PACU in stable condition.   Estimated Blood Loss: Minimal               Specimens: 8cm right thigh mass          Drucie Ip   Date: 05/26/2021  Time: 4:20 PM

## 2021-05-26 NOTE — Discharge Instructions (Signed)
Ok to shower starting tomorrow  No vigorous activity for one week  Ice pack, tylenol, and ibuprofen also for pain  

## 2021-05-26 NOTE — Anesthesia Preprocedure Evaluation (Signed)
Anesthesia Evaluation  Patient identified by MRN, date of birth, ID band Patient awake    Reviewed: Allergy & Precautions, NPO status , Patient's Chart, lab work & pertinent test results  Airway Mallampati: II  TM Distance: >3 FB Neck ROM: Full    Dental no notable dental hx. (+) Teeth Intact, Caps, Dental Advisory Given   Pulmonary neg pulmonary ROS,    Pulmonary exam normal breath sounds clear to auscultation       Cardiovascular Exercise Tolerance: Good hypertension, Normal cardiovascular exam Rhythm:Regular Rate:Normal  EKG 9/1/202 NSR, RBBB pattern, ? Septal infarct ( poor R wave progression in V leads)  Hx/o HTN with pregnancy   Neuro/Psych negative neurological ROS  negative psych ROS   GI/Hepatic negative GI ROS, Neg liver ROS,   Endo/Other  Obesity  Renal/GU negative Renal ROS  negative genitourinary   Musculoskeletal  (+) Arthritis , Right thigh lipoma   Abdominal (+) + obese,   Peds  Hematology negative hematology ROS (+)   Anesthesia Other Findings   Reproductive/Obstetrics                             Anesthesia Physical Anesthesia Plan  ASA: 2  Anesthesia Plan: MAC   Post-op Pain Management:    Induction: Intravenous  PONV Risk Score and Plan: 3 and Treatment may vary due to age or medical condition, Midazolam, Ondansetron and Dexamethasone  Airway Management Planned: Natural Airway, Simple Face Mask and Nasal Cannula  Additional Equipment:   Intra-op Plan:   Post-operative Plan:   Informed Consent: I have reviewed the patients History and Physical, chart, labs and discussed the procedure including the risks, benefits and alternatives for the proposed anesthesia with the patient or authorized representative who has indicated his/her understanding and acceptance.     Dental advisory given  Plan Discussed with: Anesthesiologist and CRNA  Anesthesia Plan  Comments:         Anesthesia Quick Evaluation

## 2021-05-26 NOTE — Interval H&P Note (Signed)
History and Physical Interval Note: no change in H and P  05/26/2021 2:59 PM  Ashley Burns  has presented today for surgery, with the diagnosis of RIGHT THIGH MASS.  The various methods of treatment have been discussed with the patient and family. After consideration of risks, benefits and other options for treatment, the patient has consented to  Procedure(s): EXCISION RIGHT THIGH MASS (Right) as a surgical intervention.  The patient's history has been reviewed, patient examined, no change in status, stable for surgery.  I have reviewed the patient's chart and labs.  Questions were answered to the patient's satisfaction.     Coralie Keens

## 2021-05-27 ENCOUNTER — Encounter (HOSPITAL_COMMUNITY): Payer: Self-pay | Admitting: Surgery

## 2021-05-27 NOTE — Anesthesia Postprocedure Evaluation (Signed)
Anesthesia Post Note  Patient: Ashley Burns  Procedure(s) Performed: EXCISION RIGHT THIGH MASS (Right: Thigh)     Patient location during evaluation: PACU Anesthesia Type: MAC Level of consciousness: awake and alert Pain management: pain level controlled Vital Signs Assessment: post-procedure vital signs reviewed and stable Respiratory status: spontaneous breathing, nonlabored ventilation, respiratory function stable and patient connected to nasal cannula oxygen Cardiovascular status: stable and blood pressure returned to baseline Postop Assessment: no apparent nausea or vomiting Anesthetic complications: no   No notable events documented.  Last Vitals:  Vitals:   05/26/21 1624 05/26/21 1636  BP: (!) 102/52 100/86  Pulse: (!) 56 (!) 48  Resp: 18 17  Temp: 36.6 C 36.7 C  SpO2: 99% 99%    Last Pain:  Vitals:   05/26/21 1636  TempSrc:   PainSc: 0-No pain                 Justyna Timoney

## 2021-06-01 LAB — SURGICAL PATHOLOGY

## 2021-07-18 LAB — HM MAMMOGRAPHY

## 2021-07-22 ENCOUNTER — Encounter: Payer: Self-pay | Admitting: Internal Medicine

## 2021-07-25 ENCOUNTER — Encounter: Payer: Self-pay | Admitting: Internal Medicine

## 2021-08-22 ENCOUNTER — Ambulatory Visit (INDEPENDENT_AMBULATORY_CARE_PROVIDER_SITE_OTHER): Payer: BC Managed Care – PPO | Admitting: Internal Medicine

## 2021-08-22 ENCOUNTER — Encounter: Payer: Self-pay | Admitting: Internal Medicine

## 2021-08-22 ENCOUNTER — Other Ambulatory Visit: Payer: Self-pay

## 2021-08-22 VITALS — BP 118/68 | HR 66 | Temp 98.8°F | Ht 65.0 in | Wt 239.8 lb

## 2021-08-22 DIAGNOSIS — R0602 Shortness of breath: Secondary | ICD-10-CM

## 2021-08-22 DIAGNOSIS — R7309 Other abnormal glucose: Secondary | ICD-10-CM | POA: Insufficient documentation

## 2021-08-22 DIAGNOSIS — R002 Palpitations: Secondary | ICD-10-CM

## 2021-08-22 DIAGNOSIS — Z0001 Encounter for general adult medical examination with abnormal findings: Secondary | ICD-10-CM | POA: Diagnosis not present

## 2021-08-22 DIAGNOSIS — R351 Nocturia: Secondary | ICD-10-CM

## 2021-08-22 DIAGNOSIS — E6609 Other obesity due to excess calories: Secondary | ICD-10-CM

## 2021-08-22 DIAGNOSIS — Z6839 Body mass index (BMI) 39.0-39.9, adult: Secondary | ICD-10-CM

## 2021-08-22 DIAGNOSIS — Z0000 Encounter for general adult medical examination without abnormal findings: Secondary | ICD-10-CM

## 2021-08-22 DIAGNOSIS — H6122 Impacted cerumen, left ear: Secondary | ICD-10-CM | POA: Diagnosis not present

## 2021-08-22 DIAGNOSIS — Z8249 Family history of ischemic heart disease and other diseases of the circulatory system: Secondary | ICD-10-CM

## 2021-08-22 LAB — POCT URINALYSIS DIPSTICK
Bilirubin, UA: NEGATIVE
Glucose, UA: NEGATIVE
Ketones, UA: NEGATIVE
Leukocytes, UA: NEGATIVE
Nitrite, UA: NEGATIVE
Protein, UA: NEGATIVE
Spec Grav, UA: 1.025 (ref 1.010–1.025)
Urobilinogen, UA: 0.2 E.U./dL
pH, UA: 5.5 (ref 5.0–8.0)

## 2021-08-22 NOTE — Progress Notes (Signed)
I,Tianna Badgett,acting as a Education administrator for Maximino Greenland, MD.,have documented all relevant documentation on the behalf of Maximino Greenland, MD,as directed by  Maximino Greenland, MD while in the presence of Maximino Greenland, MD.  This visit occurred during the SARS-CoV-2 public health emergency.  Safety protocols were in place, including screening questions prior to the visit, additional usage of staff PPE, and extensive cleaning of exam room while observing appropriate contact time as indicated for disinfecting solutions.  Subjective:     Patient ID: Ashley Burns , female    DOB: 09/08/1961 , 60 y.o.   MRN: [id number]   Chief Complaint  Patient presents with   Annual Exam    HPI  Patient is here for physical exam. Patient receives her GYN from Dr Garwin Brothers. Her last PAP was 07/20/2021    Past Medical History:  Diagnosis Date   Arthritis    Bundle branch block    Hypertension    with pregnancy only     Family History  Problem Relation Age of Onset   Cancer Mother        breast   Diabetes Father    Hypertension Father    Heart disease Father    Cancer Cousin        breast   Colon cancer Neg Hx    Esophageal cancer Neg Hx    Stomach cancer Neg Hx    Rectal cancer Neg Hx      Current Outpatient Medications:    BIOTIN PO, Take 2,500 mcg by mouth daily. Hair/skin/nails, Disp: , Rfl:    CALCIUM PO, Take 600 mg by mouth daily., Disp: , Rfl:    Cholecalciferol (VITAMIN D3) 125 MCG (5000 UT) TABS, Take 5,000 Units by mouth daily., Disp: , Rfl:    Cyanocobalamin (VITAMIN B-12 IJ), Inject as directed every Thursday., Disp: , Rfl:    diclofenac Sodium (VOLTAREN) 1 % GEL, Apply 2 g topically 4 (four) times daily. (Patient taking differently: Apply 2 g topically daily as needed (Knee pain).), Disp: 100 g, Rfl: 2   Ferrous Sulfate (IRON PO), Take 65 mg by mouth once a week., Disp: , Rfl:    FIBER PO, Take 1 tablet by mouth daily. Vita fusion, Disp: , Rfl:    oxyCODONE (OXY  IR/ROXICODONE) 5 MG immediate release tablet, Take 1 tablet (5 mg total) by mouth every 6 (six) hours as needed for moderate pain or severe pain., Disp: 25 tablet, Rfl: 0   TURMERIC PO, Take 1 tablet by mouth daily., Disp: , Rfl:    zinc gluconate 50 MG tablet, Take 50 mg by mouth daily., Disp: , Rfl:    Allergies  Allergen Reactions   Shellfish Allergy     itching      The patient states she uses post menopausal status for birth control. Last LMP was Patient's last menstrual period was 11/06/2012.. Negative for Dysmenorrhea. Negative for: breast discharge, breast lump(s), breast pain and breast self exam. Associated symptoms include abnormal vaginal bleeding. Pertinent negatives include abnormal bleeding (hematology), anxiety, decreased libido, depression, difficulty falling sleep, dyspareunia, history of infertility, nocturia, sexual dysfunction, sleep disturbances, urinary incontinence, urinary urgency, vaginal discharge and vaginal itching. Diet regular.The patient states her exercise level is  minimal.   . The patient's tobacco use is:  Social History   Tobacco Use  Smoking Status Never  Smokeless Tobacco Never  . She has been exposed to passive smoke. The patient's alcohol use is:  Social History  Substance and Sexual Activity  Alcohol Use Not Currently   Review of Systems  Constitutional: Negative.   HENT: Negative.    Eyes: Negative.   Respiratory:  Positive for shortness of breath.        She states this has been going on for a few months. Denies SOB w/ exertion. States "her breathing is different". She is not sure what could be contributing to her sx. Denies associated chest pain and palpitations. However, states she had palpitations back in October.   Cardiovascular: Negative.   Gastrointestinal: Negative.   Endocrine: Negative.   Genitourinary:  Positive for frequency.  Musculoskeletal: Negative.   Skin: Negative.   Allergic/Immunologic: Negative.   Neurological:  Negative.   Hematological: Negative.   Psychiatric/Behavioral: Negative.      Today's Vitals   08/22/21 0845  BP: 118/68  Pulse: 66  Temp: 98.8 F (37.1 C)  TempSrc: Oral  Weight: 239 lb 12.8 oz (108.8 kg)  Height: 5' 5"  (1.651 m)   Body mass index is 39.9 kg/m.  Wt Readings from Last 3 Encounters:  08/22/21 239 lb 12.8 oz (108.8 kg)  05/26/21 242 lb (109.8 kg)  05/12/21 241 lb 6.4 oz (109.5 kg)    Objective:  Physical Exam Vitals and nursing note reviewed.  Constitutional:      Appearance: Normal appearance. She is obese.  HENT:     Head: Normocephalic and atraumatic.     Right Ear: Tympanic membrane, ear canal and external ear normal.     Left Ear: Ear canal and external ear normal. There is impacted cerumen.     Nose:     Comments: Masked     Mouth/Throat:     Comments: Masked  Eyes:     Extraocular Movements: Extraocular movements intact.     Conjunctiva/sclera: Conjunctivae normal.     Pupils: Pupils are equal, round, and reactive to light.  Cardiovascular:     Rate and Rhythm: Normal rate and regular rhythm.     Pulses: Normal pulses.     Heart sounds: Normal heart sounds.  Pulmonary:     Effort: Pulmonary effort is normal.     Breath sounds: Normal breath sounds.  Abdominal:     General: Bowel sounds are normal.     Palpations: Abdomen is soft.  Genitourinary:    Comments: deferred Musculoskeletal:        General: Normal range of motion.     Cervical back: Normal range of motion and neck supple.  Skin:    General: Skin is warm and dry.  Neurological:     General: No focal deficit present.     Mental Status: She is alert and oriented to person, place, and time.  Psychiatric:        Mood and Affect: Mood normal.        Behavior: Behavior normal.        Assessment And Plan:     1. Encounter for annual physical exam Comments: A full exam was performed. Importance of monthly self breast exams was discussed with the patient. She reports having  pap/mammo performed recently with GYN. Mammo performed at [facility], was told abnormal. Awaiting f/u appt for diagnostic studies. PATIENT IS ADVISED TO GET 30-45 MINUTES REGULAR EXERCISE NO LESS THAN FOUR TO FIVE DAYS PER WEEK - BOTH WEIGHTBEARING EXERCISES AND AEROBIC ARE RECOMMENDED.  PATIENT IS ADVISED TO FOLLOW A HEALTHY DIET WITH AT LEAST SIX FRUITS/VEGGIES PER DAY, DECREASE INTAKE OF RED MEAT, AND TO INCREASE FISH INTAKE  TO TWO DAYS PER WEEK.  MEATS/FISH SHOULD NOT BE FRIED, BAKED OR BROILED IS PREFERABLE.  IT IS ALSO IMPORTANT TO CUT BACK ON YOUR SUGAR INTAKE. PLEASE AVOID ANYTHING WITH ADDED SUGAR, CORN SYRUP OR OTHER SWEETENERS. IF YOU MUST USE A SWEETENER, YOU CAN TRY STEVIA. IT IS ALSO IMPORTANT TO AVOID ARTIFICIALLY SWEETENERS AND DIET BEVERAGES. LASTLY, I SUGGEST WEARING SPF 50 SUNSCREEN ON EXPOSED PARTS AND ESPECIALLY WHEN IN THE DIRECT SUNLIGHT FOR AN EXTENDED PERIOD OF TIME.  PLEASE AVOID FAST FOOD RESTAURANTS AND INCREASE YOUR WATER INTAKE.  - CBC - Hemoglobin A1c - CMP14+EGFR - Lipid panel - Insulin, random(561)  2. Nocturia Comments: I will check urinalysis today. She reports getting up at least twice nightly. She denies daytime somnolence, snoring and non-restorative sleep.  - POCT Urinalysis Dipstick (81002)  3. Shortness of breath Comments: Sx occur at rest. I will check EKG. EKG significant for NSR w/ RBBB - this is not a new finding. She denies having orthopnea and DOE. ?anxiety - Ambulatory referral to Cardiology - EKG 12-Lead  4. Palpitations Comments: I will check labs as listed below. I will also check EKG. She may benefit from Mg supplementation and encouraged to stay well hydrated.  - Magnesium - Ambulatory referral to Cardiology - EKG 12-Lead - TSH + free T4  5. Left ear impacted cerumen AFTER OBTAINING VERBAL CONSENT, LEFT EAR WAS FLUSHED BY IRRIGATION. SHE TOLERATED PROCEDURE WELL WITHOUT ANY COMPLICATIONS. NO TM ABNORMALITIES WERE NOTED.  - Ear Lavage  6.  Class 2 obesity due to excess calories without serious comorbidity with body mass index (BMI) of 39.0 to 39.9 in adult Comments: She is encouraged to strive for BMI less than 30 to decrease cardiac risk. She is advised to aim for at least 150 minutes of exercise per week.   7. Family history of heart disease Comments: She agrees to Cardiology referral for further assessment of cardiac risk. Unable to perform ASCVD risk due to no previous lipid. Will check today.  - Ambulatory referral to Cardiology  Patient was given opportunity to ask questions. Patient verbalized understanding of the plan and was able to repeat key elements of the plan. All questions were answered to their satisfaction.   I, Maximino Greenland, MD, have reviewed all documentation for this visit. The documentation on 08/22/21 for the exam, diagnosis, procedures, and orders are all accurate and complete.   THE PATIENT IS ENCOURAGED TO PRACTICE SOCIAL DISTANCING DUE TO THE COVID-19 PANDEMIC.

## 2021-08-22 NOTE — Patient Instructions (Signed)

## 2021-08-23 LAB — CMP14+EGFR
ALT: 24 IU/L (ref 0–32)
AST: 26 IU/L (ref 0–40)
Albumin/Globulin Ratio: 1.5 (ref 1.2–2.2)
Albumin: 4.1 g/dL (ref 3.8–4.9)
Alkaline Phosphatase: 87 IU/L (ref 44–121)
BUN/Creatinine Ratio: 25 (ref 12–28)
BUN: 16 mg/dL (ref 8–27)
Bilirubin Total: 0.4 mg/dL (ref 0.0–1.2)
CO2: 24 mmol/L (ref 20–29)
Calcium: 9.6 mg/dL (ref 8.7–10.3)
Chloride: 103 mmol/L (ref 96–106)
Creatinine, Ser: 0.63 mg/dL (ref 0.57–1.00)
Globulin, Total: 2.7 g/dL (ref 1.5–4.5)
Glucose: 91 mg/dL (ref 70–99)
Potassium: 4.2 mmol/L (ref 3.5–5.2)
Sodium: 142 mmol/L (ref 134–144)
Total Protein: 6.8 g/dL (ref 6.0–8.5)
eGFR: 101 mL/min/{1.73_m2} (ref >59)

## 2021-08-23 LAB — HEMOGLOBIN A1C
Est. average glucose Bld gHb Est-mCnc: 117 mg/dL
Hgb A1c MFr Bld: 5.7 % — ABNORMAL HIGH (ref 4.8–5.6)

## 2021-08-23 LAB — INSULIN, RANDOM: INSULIN: 12.4 u[IU]/mL (ref 2.6–24.9)

## 2021-08-23 LAB — LIPID PANEL
Chol/HDL Ratio: 4 ratio (ref 0.0–4.4)
Cholesterol, Total: 210 mg/dL — ABNORMAL HIGH (ref 100–199)
HDL: 53 mg/dL (ref >39)
LDL Chol Calc (NIH): 144 mg/dL — ABNORMAL HIGH (ref 0–99)
Triglycerides: 70 mg/dL (ref 0–149)
VLDL Cholesterol Cal: 13 mg/dL (ref 5–40)

## 2021-08-23 LAB — CBC
Hematocrit: 40.3 % (ref 34.0–46.6)
Hemoglobin: 13.4 g/dL (ref 11.1–15.9)
MCH: 29.2 pg (ref 26.6–33.0)
MCHC: 33.3 g/dL (ref 31.5–35.7)
MCV: 88 fL (ref 79–97)
Platelets: 250 10*3/uL (ref 150–450)
RBC: 4.59 x10E6/uL (ref 3.77–5.28)
RDW: 12.8 % (ref 11.7–15.4)
WBC: 6.2 10*3/uL (ref 3.4–10.8)

## 2021-08-23 LAB — TSH+FREE T4
Free T4: 0.87 ng/dL (ref 0.82–1.77)
TSH: 3.72 u[IU]/mL (ref 0.450–4.500)

## 2021-08-23 LAB — MAGNESIUM: Magnesium: 1.8 mg/dL (ref 1.6–2.3)

## 2021-09-07 ENCOUNTER — Encounter: Payer: Self-pay | Admitting: Internal Medicine

## 2021-10-14 ENCOUNTER — Encounter: Payer: Self-pay | Admitting: Internal Medicine

## 2021-10-24 NOTE — Progress Notes (Signed)
Cardiology Office Note:    Date:  10/26/2021   ID:  DAY GREB, DOB 08/12/1961, MRN [id number]  PCP:  Glendale Chard, MD  Cardiologist:  None    Referring MD: Glendale Chard, MD   No chief complaint on file.   History of Present Illness:    Ashley Burns is a 61 y.o. female with a hx of hypertension with pregnancy and RBBB here today for the evaluation of shortness of breath, palpitations, and family history of heart disease at the request of Dr. Baird Cancer.  She last saw Dr. Baird Cancer 07/2021 for annual physical exam. She complained of shortness of breath at rest and palpitations lasting a few months prior. She also endorsed a family history of heart disease on her father's side. She was referred to cardiology.  Today, she is doing well. For 2 months, she noticed becoming short of breath occurring intermittently. The episodes occurred sporadically. She would occasionally wake up feeling short of breath. However, she has had no recent episodes of shortness of breath. She also had chest discomfort she describes as a "patter" and an abnormal fluttering sensation. Occasionally, her bilateral ankles and R knee will swell. She will apply voltaren gel to her knee as needed. She finds compression socks uncomfortable but notices less swelling when wearing boots. Her brother used crutches due to neonatal leg injuries and had various MIs and passed away in his 27s. Her father also had cardiovascular disease. She had a lipoma removed from her R hip in the past. After the surgery, she began taking calcium supplements. She wonders if the supplements could have affected her heart. She used to walk daily. However, in the past year, she has been unable to exercise regularly because of her job. She works in [employer] applications and works from home. She eats out often but tries to opt for chicken, fish, and vegetables. She does not drink coffee or alcohol. Due to the lack of activity, she has gained 10 to 20 lbs in  the past year. When she wakes up, she feels rested. She denies any snoring, lightheadedness, headaches, syncope, orthopnea, or PND.  Past Medical History:  Diagnosis Date   Arthritis    Bundle branch block    Hypertension    with pregnancy only   Morbid obesity ([place]) 10/26/2021   Prediabetes 10/26/2021   Pure hypercholesterolemia 10/26/2021   RBBB 10/26/2021   Shortness of breath 10/26/2021    Past Surgical History:  Procedure Laterality Date   CESAREAN SECTION     x3   COLONOSCOPY     EXCISION MASS LOWER EXTREMETIES Right 05/26/2021   Procedure: EXCISION RIGHT THIGH MASS;  Surgeon: Coralie Keens, MD;  Location: [facility];  Service: General;  Laterality: Right;    Current Medications: Current Meds  Medication Sig   BIOTIN PO Take 2,500 mcg by mouth daily. Hair/skin/nails   CALCIUM PO Take 600 mg by mouth daily.   Cholecalciferol (VITAMIN D3) 125 MCG (5000 UT) TABS Take 5,000 Units by mouth daily.   Cyanocobalamin (VITAMIN B-12 IJ) Inject as directed every Thursday.   diclofenac Sodium (VOLTAREN) 1 % GEL Apply 2 g topically 4 (four) times daily. (Patient taking differently: Apply 2 g topically daily as needed (Knee pain).)   Ferrous Sulfate (IRON PO) Take 65 mg by mouth once a week.   TURMERIC PO Take 1 tablet by mouth daily.   zinc gluconate 50 MG tablet Take 50 mg by mouth daily.     Allergies:   Shellfish  allergy   Social History   Socioeconomic History   Marital status: Married    Spouse name: Not on file   Number of children: Not on file   Years of education: Not on file   Highest education level: Not on file  Occupational History   Not on file  Tobacco Use   Smoking status: Never   Smokeless tobacco: Never  Vaping Use   Vaping Use: Never used  Substance and Sexual Activity   Alcohol use: Not Currently   Drug use: No   Sexual activity: Not on file  Other Topics Concern   Not on file  Social History Narrative   Not on file   Social Determinants of Health    Financial Resource Strain: Low Risk    Difficulty of Paying Living Expenses: Not hard at all  Food Insecurity: No Food Insecurity   Worried About Running Out of Food in the Last Year: Never true   [place] in the Last Year: Never true  Transportation Needs: No Transportation Needs   Lack of Transportation (Medical): No   Lack of Transportation (Non-Medical): No  Physical Activity: Inactive   Days of Exercise per Week: 0 days   Minutes of Exercise per Session: 0 min  Stress: Not on file  Social Connections: Not on file     Family History: The patient's family history includes Cancer in her cousin and mother; Diabetes in her father; Heart attack in her brother and father; Heart disease in her father; Hypertension in her father. There is no history of Colon cancer, Esophageal cancer, Stomach cancer, or Rectal cancer.  ROS:   Please see the history of present illness.  (+) Shortness of breath (+) Chest discomfort (+) Palpitations (+) LE edema (bilateral ankles, R knee) (+) Weight gain    All other systems reviewed and negative.   EKGs/Labs/Other Studies Reviewed:    The following studies were reviewed today: Lower Extremity Duplex 09/05/12 - No evidence of deep vein thrombosis involving the    visualized veins of the left lower extremity.  - No evidence of Baker's cyst on the left.   EKG:   10/26/21: Sinus bradycardia, rate 55 bpm; LAFB and RBBB  Recent Labs: 08/22/2021: ALT 24; BUN 16; Creatinine, Ser 0.63; Hemoglobin 13.4; Magnesium 1.8; Platelets 250; Potassium 4.2; Sodium 142; TSH 3.720   Recent Lipid Panel    Component Value Date/Time   CHOL 210 (H) 08/22/2021 0933   TRIG 70 08/22/2021 0933   HDL 53 08/22/2021 0933   CHOLHDL 4.0 08/22/2021 0933   LDLCALC 144 (H) 08/22/2021 0933    Physical Exam:    VS:  BP 118/82 (BP Location: Right Arm, Patient Position: Sitting, Cuff Size: Large)    Pulse (!) 55    Ht 5\' 5"  (1.651 m)    Wt 242 lb (109.8 kg)    LMP  11/06/2012    BMI 40.27 kg/m  , BMI Body mass index is 40.27 kg/m. GENERAL:  Well appearing HEENT: Pupils equal round and reactive, fundi not visualized, oral mucosa unremarkable NECK:  No jugular venous distention, waveform within normal limits, carotid upstroke brisk and symmetric, no bruits, no thyromegaly LUNGS:  Clear to auscultation bilaterally HEART:  Bradycardic.  PMI not displaced or sustained,S1 and S2 within normal limits, no S3, no S4, no clicks, no rubs, no murmurs ABD:  Flat, positive bowel sounds normal in frequency in pitch, no bruits, no rebound, no guarding, no midline pulsatile mass, no hepatomegaly, no  splenomegaly EXT:  2 plus pulses throughout, trace edema, no cyanosis no clubbing SKIN:  No rashes no nodules NEURO:  Cranial nerves II through XII grossly intact, motor grossly intact throughout PSYCH:  Cognitively intact, oriented to person place and time  ASSESSMENT:    1. Shortness of breath   2. Pure hypercholesterolemia   3. Morbid obesity ([place])   4. Prediabetes   5. RBBB    PLAN:    Shortness of breath Symptoms seems to have resolved and were not exertional.  We discussed that this may be a sign of sleep apnea or silent GERD.  She does not seem to have any other symptoms for sleep apnea so we will not pursue a sleep study at this time.  She notes that she does tend to eat late at night and goes to sleep soon after.  She will work on this.  She does not otherwise have any GERD symptoms.  Will not start any medications at this time.  She is euvolemic on exam and has no exertional symptoms or murmurs on exam so we will not get an echo at this time.  She is going to work on increasing her exercise and should she have further symptoms we will reevaluate at follow-up.  Pure hypercholesterolemia Lipids are poorly controlled.  Her LDL was 144.  However her ASCVD 10-year risk is 4.4%.  She does have a family history of CAD, and premature CAD.  She is interested in getting  a coronary calcium score to better understand her risk.  For now she is going to work on increasing her exercise and improving her diet.  Morbid obesity ([place]) She is very motivated to make some changes.  We will refer her to healthy weight and wellness.  We also discussed some diet and exercise tips today.  Her goal is at least 150 minutes weekly.  She will work on increasing her protein and limiting carbohydrate intake.  She will also start Ozempic after a referral to our pharmacist.  She does have prediabetes so this should help.  Prediabetes Hemoglobin A1c was 5.7%.  Diet and exercise and starting Ozempic as above.  RBBB No clinical significance.   Disposition: FU with Tianni Escamilla C. Oval Linsey, MD, [facility] in 3 months  Medication Adjustments/Labs and Tests Ordered: Current medicines are reviewed at length with the patient today.  Concerns regarding medicines are outlined above.  Orders Placed This Encounter  Procedures   CT CARDIAC SCORING (SELF PAY ONLY)   AMB Referral to [facility]   Ambulatory referral to [facility]   EKG 12-Lead   No orders of the defined types were placed in this encounter.  I,Mykaella Javier,acting as a scribe for Skeet Latch, MD.,have documented all relevant documentation on the behalf of Skeet Latch, MD,as directed by  Skeet Latch, MD while in the presence of Skeet Latch, MD.  I, Celada Oval Linsey, MD have reviewed all documentation for this visit.  The documentation of the exam, diagnosis, procedures, and orders on 10/26/2021 are all accurate and complete.   Signed, Skeet Latch, MD  10/26/2021 5:41 PM    [place]

## 2021-10-26 ENCOUNTER — Other Ambulatory Visit: Payer: Self-pay

## 2021-10-26 ENCOUNTER — Ambulatory Visit (HOSPITAL_BASED_OUTPATIENT_CLINIC_OR_DEPARTMENT_OTHER): Payer: BC Managed Care – PPO | Admitting: Cardiovascular Disease

## 2021-10-26 ENCOUNTER — Encounter (HOSPITAL_BASED_OUTPATIENT_CLINIC_OR_DEPARTMENT_OTHER): Payer: Self-pay | Admitting: Cardiovascular Disease

## 2021-10-26 DIAGNOSIS — R7303 Prediabetes: Secondary | ICD-10-CM | POA: Diagnosis not present

## 2021-10-26 DIAGNOSIS — R0602 Shortness of breath: Secondary | ICD-10-CM

## 2021-10-26 DIAGNOSIS — R001 Bradycardia, unspecified: Secondary | ICD-10-CM | POA: Insufficient documentation

## 2021-10-26 DIAGNOSIS — I4510 Unspecified right bundle-branch block: Secondary | ICD-10-CM

## 2021-10-26 DIAGNOSIS — E7800 Pure hypercholesterolemia, unspecified: Secondary | ICD-10-CM

## 2021-10-26 HISTORY — DX: Shortness of breath: R06.02

## 2021-10-26 HISTORY — DX: Unspecified right bundle-branch block: I45.10

## 2021-10-26 HISTORY — DX: Prediabetes: R73.03

## 2021-10-26 HISTORY — DX: Morbid (severe) obesity due to excess calories: E66.01

## 2021-10-26 HISTORY — DX: Pure hypercholesterolemia, unspecified: E78.00

## 2021-10-26 NOTE — Assessment & Plan Note (Signed)
Symptoms seems to have resolved and were not exertional.  We discussed that this may be a sign of sleep apnea or silent GERD.  She does not seem to have any other symptoms for sleep apnea so we will not pursue a sleep study at this time.  She notes that she does tend to eat late at night and goes to sleep soon after.  She will work on this.  She does not otherwise have any GERD symptoms.  Will not start any medications at this time.  She is euvolemic on exam and has no exertional symptoms or murmurs on exam so we will not get an echo at this time.  She is going to work on increasing her exercise and should she have further symptoms we will reevaluate at follow-up.

## 2021-10-26 NOTE — Patient Instructions (Signed)
Medication Instructions:  Your physician recommends that you continue on your current medications as directed. Please refer to the Current Medication list given to you today.   *If you need a refill on your cardiac medications before your next appointment, please call your pharmacy*  Lab Work: NONE  Testing/Procedures: CACIUM SCORE - THIS WILL COST YOU $99 OUT OF POCKET   Follow-Up: At [facility], you and your health needs are our priority.  As part of our continuing mission to provide you with exceptional heart care, we have created designated Provider Care Teams.  These Care Teams include your primary Cardiologist (physician) and Advanced Practice Providers (APPs -  Physician Assistants and Nurse Practitioners) who all work together to provide you with the care you need, when you need it.  We recommend signing up for the patient portal called "MyChart".  Sign up information is provided on this After Visit Summary.  MyChart is used to connect with patients for Virtual Visits (Telemedicine).  Patients are able to view lab/test results, encounter notes, upcoming appointments, etc.  Non-urgent messages can be sent to your provider as well.   To learn more about what you can do with MyChart, go to [email or web address].    Your next appointment:   3 month(s)  The format for your next appointment:   In Person  Provider:   Skeet Latch, MD   You have been referred to [place] have been referred to [place]  IF YOU DO NOT HEAR FROM THEM YOU CAN CALL THE OFFICE DIRECTLY AT NUMBER HIGHLIGHTED ON FOLLOW PAGE   Other Instructions  Exercise recommendations: The American Heart Association recommends 150 minutes of moderate intensity exercise weekly. Try 30 minutes of moderate intensity exercise 4-5 times per week. This could include walking, jogging, or swimming.

## 2021-10-26 NOTE — Assessment & Plan Note (Signed)
Hemoglobin A1c was 5.7%.  Diet and exercise and starting Ozempic as above.

## 2021-10-26 NOTE — Assessment & Plan Note (Signed)
No clinical significance.

## 2021-10-26 NOTE — Assessment & Plan Note (Signed)
Lipids are poorly controlled.  Her LDL was 144.  However her ASCVD 10-year risk is 4.4%.  She does have a family history of CAD, and premature CAD.  She is interested in getting a coronary calcium score to better understand her risk.  For now she is going to work on increasing her exercise and improving her diet.

## 2021-10-26 NOTE — Assessment & Plan Note (Signed)
She is very motivated to make some changes.  We will refer her to healthy weight and wellness.  We also discussed some diet and exercise tips today.  Her goal is at least 150 minutes weekly.  She will work on increasing her protein and limiting carbohydrate intake.  She will also start Ozempic after a referral to our pharmacist.  She does have prediabetes so this should help.

## 2021-10-28 ENCOUNTER — Encounter (HOSPITAL_BASED_OUTPATIENT_CLINIC_OR_DEPARTMENT_OTHER): Payer: Self-pay | Admitting: Cardiovascular Disease

## 2021-12-06 ENCOUNTER — Other Ambulatory Visit: Payer: Self-pay

## 2021-12-06 ENCOUNTER — Encounter: Payer: Self-pay | Admitting: Pharmacist Clinician (PhC)/ Clinical Pharmacy Specialist

## 2021-12-06 ENCOUNTER — Ambulatory Visit (INDEPENDENT_AMBULATORY_CARE_PROVIDER_SITE_OTHER): Payer: BC Managed Care – PPO | Admitting: Pharmacist Clinician (PhC)/ Clinical Pharmacy Specialist

## 2021-12-06 DIAGNOSIS — R7303 Prediabetes: Secondary | ICD-10-CM | POA: Diagnosis not present

## 2021-12-06 NOTE — Progress Notes (Signed)
HPI: ?Ashley Burns is a 61 y.o. female patient referred to pharmacy clinic by Dr. Oval Linsey to initiate weight loss therapy with GLP1-RA.  Most recent BMI 38.8 today.  Patient notes that her work keeps her busy and she notes a 10-20 pound weight gain in the past year.   ? ?Significant medical history: ?hyperlipidemia 11/22 LDL 144 - no medication  ?Pre-diabetes 11/22 A1c 5.7 - no medication, working on weight/lifestyle  ? ?Current weight management medications: none ? ?Previously tried meds: none ? ?Current meds that may affect weight: none ? ?Baseline weight/BMI: 38.8 ? ?Insurance payor: [employer] ? ?Diet: mix of home and eating out - goes to [employer] daily for salad, eats a lot of vegetables ?-Breakfast: skips most days, but will eat fruit or yogurt some days ?-Snacks: admits to snacking - chips, cookies, crackers ?-Drinks: no soda or tea, only water, occasionally flavored water ? ?Exercise: walking 30 minutes twice weekly ? ?Family History: brother had MI, died at 34; father with heart disease, mother lived to 59; 2 children healthy ? ?Confirmed patient not pregnant and no personal or family history of medullary thyroid carcinoma (MTC) or Multiple Endocrine Neoplasia syndrome type 2 (MEN 2).  ? ? ?Labs: ?Lab Results  ?Component Value Date  ? HGBA1C 5.7 (H) 08/22/2021  ? ? ?Wt Readings from Last 1 Encounters:  ?12/06/21 244 lb (110.7 kg)  ? ? ?BP Readings from Last 1 Encounters:  ?12/06/21 120/80  ? ?Pulse Readings from Last 1 Encounters:  ?12/06/21 77  ? ? ?   ?Component Value Date/Time  ? CHOL 210 (H) 08/22/2021 0933  ? TRIG 70 08/22/2021 0933  ? HDL 53 08/22/2021 0933  ? CHOLHDL 4.0 08/22/2021 0933  ? [place] 144 (H) 08/22/2021 0933  ? ? ?Past Medical History:  ?Diagnosis Date  ? Arthritis   ? Bundle branch block   ? Hypertension   ? with pregnancy only  ? Morbid obesity ([place] [place]) 10/26/2021  ? Prediabetes 10/26/2021  ? Pure hypercholesterolemia 10/26/2021  ? RBBB 10/26/2021  ? Shortness of breath 10/26/2021   ? ? ?Current Outpatient Medications on File Prior to Visit  ?Medication Sig Dispense Refill  ? BIOTIN PO Take 2,500 mcg by mouth daily. Hair/skin/nails    ? CALCIUM PO Take 600 mg by mouth daily.    ? Cholecalciferol (VITAMIN D3) 125 MCG (5000 UT) TABS Take 5,000 Units by mouth daily.    ? Cyanocobalamin (VITAMIN B-12 IJ) Inject as directed every Thursday.    ? diclofenac Sodium (VOLTAREN) 1 % GEL Apply 2 g topically 4 (four) times daily. (Patient taking differently: Apply 2 g topically daily as needed (Knee pain).) 100 g 2  ? Ferrous Sulfate (IRON PO) Take 65 mg by mouth once a week.    ? TURMERIC PO Take 1 tablet by mouth daily.    ? zinc gluconate 50 MG tablet Take 50 mg by mouth daily.    ? calcium carbonate (TUMS EX) 750 MG chewable tablet Chew 1 tablet by mouth as needed.    ? ?No current facility-administered medications on file prior to visit.  ? ? ?Allergies  ?Allergen Reactions  ? Shellfish Allergy   ?  itching  ? ? ?Obesity ? ?1. Weight loss - Patient has not met goal of at least 5% of body weight loss with comprehensive lifestyle modifications alone in the past 3-6 months. Pharmacotherapy is appropriate to pursue as augmentation. Will start Wegovy.  ? ?Confirmed patient not pregnant and no personal or family  history of medullary thyroid carcinoma (MTC) or Multiple Endocrine Neoplasia syndrome type 2 (MEN 2).  ? ?Advised patient on common side effects including nausea, diarrhea, dyspepsia, decreased appetite, and fatigue. Counseled patient on reducing meal size and how to titrate medication to minimize side effects. Patient aware to call if intolerable side effects or if experiencing dehydration, abdominal pain, or dizziness. Patient will adhere to dietary modifications and will target at least 150 minutes of moderate intensity exercise weekly.  ? ?Injection technique reviewed at today's visit. ? ?Titration Plan:  ?Will plan to follow the titration plan as below, pending patient is tolerating each dose  before increasing to the next. Can slow titration if needed for tolerability.  Patient aware to call if would like to continue another month on any specific dose.   ?  ?-Month 1: Inject 0.25 mg SQ once weekly x 4 weeks ?-Month 2: Inject 0.5 mg SQ once weekly x 4 weeks ?-Month 3: Inject 1 mg SQ once weekly x 4 weeks ?-Month 4+: Inject 1.7 mg SQ once weekly  ? ?Follow up in 3 months ? ? ? ?Tommy Medal PharmD CPP [facility] ?[facility] ? ?

## 2021-12-06 NOTE — Patient Instructions (Addendum)
We will start the process to get Wegovy covered by your insurance company.   ? ?Start with samples of 0.25 g dose.  Use 1 pen every 7 days.  Please be sure to cut your meal portions by about half in order to avoid GI upset.   After the 4 weeks, if you feel that you need another 4 weeks at this dose, please give me a call.  Otherwise, increase to the 0.5 mg dose for 4 weeks.   ? ?For any questions or concerns, please feel free to reach out to [place] at [phone] (Nilaya Bouie/Chris) ? ? ?

## 2021-12-07 ENCOUNTER — Encounter: Payer: Self-pay | Admitting: Pharmacist Clinician (PhC)/ Clinical Pharmacy Specialist

## 2021-12-07 NOTE — Assessment & Plan Note (Signed)
?  1. Weight loss - Patient has not met goal of at least 5% of body weight loss with comprehensive lifestyle modifications alone in the past 3-6 months. Pharmacotherapy is appropriate to pursue as augmentation. Will start Wegovy.  ? ?Confirmed patient not pregnant and no personal or family history of medullary thyroid carcinoma (MTC) or Multiple Endocrine Neoplasia syndrome type 2 (MEN 2).  ? ?Advised patient on common side effects including nausea, diarrhea, dyspepsia, decreased appetite, and fatigue. Counseled patient on reducing meal size and how to titrate medication to minimize side effects. Patient aware to call if intolerable side effects or if experiencing dehydration, abdominal pain, or dizziness. Patient will adhere to dietary modifications and will target at least 150 minutes of moderate intensity exercise weekly.  ? ?Injection technique reviewed at today's visit. ? ?Titration Plan:  ?Will plan to follow the titration plan as below, pending patient is tolerating each dose before increasing to the next. Can slow titration if needed for tolerability.  Patient aware to call if would like to continue another month on any specific dose.   ?  ?-Month 1: Inject 0.25 mg SQ once weekly x 4 weeks ?-Month 2: Inject 0.5 mg SQ once weekly x 4 weeks ?-Month 3: Inject 1 mg SQ once weekly x 4 weeks ?-Month 4+: Inject 1.7 mg SQ once weekly  ? ?Follow up in 3 months ? ? ?

## 2021-12-14 ENCOUNTER — Ambulatory Visit (INDEPENDENT_AMBULATORY_CARE_PROVIDER_SITE_OTHER)
Admission: RE | Admit: 2021-12-14 | Discharge: 2021-12-14 | Disposition: A | Discharge status: Home / Self Care | Payer: Self-pay | Source: Ambulatory Visit | Attending: Cardiovascular Disease | Admitting: Cardiovascular Disease

## 2021-12-14 ENCOUNTER — Other Ambulatory Visit: Payer: Self-pay

## 2021-12-14 ENCOUNTER — Telehealth (HOSPITAL_BASED_OUTPATIENT_CLINIC_OR_DEPARTMENT_OTHER): Payer: Self-pay | Admitting: *Deleted

## 2021-12-14 DIAGNOSIS — E7800 Pure hypercholesterolemia, unspecified: Secondary | ICD-10-CM

## 2021-12-14 NOTE — Telephone Encounter (Signed)
Received call report on CT ? ?IMPRESSION: ?1. No acute extracardiac findings. ?2. Poorly defined ground-glass opacity in the medial right lower ?lobe. Based on the configuration, this could represent ?postinflammatory scarring but indeterminate. No comparison imaging ?to evaluate for stability. Initial follow-up with CT at 6-12 months ?is recommended to confirm persistence. If persistent, repeat CT is ?recommended every 2 years until 5 years of stability has been ?established. This recommendation follows the consensus statement: ?Guidelines for Management of Incidental Pulmonary Nodules Detected ?on CT Images: From the Fleischner Society 2017; Radiology 2017; ?284:228-243. ? ?Will forward to Dr Oval Linsey for review  ?

## 2021-12-22 NOTE — Telephone Encounter (Signed)
Meryl Crutch, RN  ?12/21/2021  1:55 PM EDT Back to Top  ?  ?Left message to call back.  ? ?

## 2021-12-27 NOTE — Telephone Encounter (Signed)
-----   Message from Skeet Latch, MD sent at 12/20/2021  3:36 PM EDT ----- ?There is a small amount of plaque in her heart arteries, but it is more than would be expected for age.  I recommend that her cholesterol levels be much lower to prevent this from progressing.  Her LDL should be less than 70.  We are unlikely to get better with just diet and exercise.  Recommend rosuvastatin 10 mg.  Repeat lipids and a CMP in 2 to 3 months. ?

## 2021-12-27 NOTE — Telephone Encounter (Signed)
Left message to call back  

## 2022-01-06 ENCOUNTER — Telehealth: Payer: Self-pay | Admitting: Cardiovascular Disease

## 2022-01-06 NOTE — Telephone Encounter (Signed)
Pt c/o medication issue: ? ?1. Name of Medication:  ?Ashley Burns ? ?2. How are you currently taking this medication (dosage and times per day)?  ? ?3. Are you having a reaction (difficulty breathing--STAT)?  ? ?4. What is your medication issue?  ? ?Ashley Burns was never called in to her pharmacy and patient would like to know if she still needs to take it. [place] advise. ? ? ?

## 2022-01-06 NOTE — Telephone Encounter (Signed)
Looks like you saw this patient so routing to you. Thanks!  ?

## 2022-01-09 ENCOUNTER — Telehealth: Payer: Self-pay

## 2022-01-09 NOTE — Telephone Encounter (Signed)
Routing to myself to followup on the process ?Pa sent in for wegovy: ?Assunta Curtis ([employer]ace) - [phone] ?Wegovy 0.'25MG'$ /0.5ML auto-injectors ?Status: PA Request ?Created: April 17th, 2023 ?Sent: April 17th, 2023 ?

## 2022-01-09 NOTE — Telephone Encounter (Signed)
-----   Message from Rockne Menghini, RPH-CPP sent at 01/09/2022 10:01 AM EDT ----- ?Regarding: PA - Wegovy ?Could you please do a PA for Wegovy 0.5 mg ? ?Thank you! ? ?

## 2022-01-10 MED ORDER — SEMAGLUTIDE-WEIGHT MANAGEMENT 1 MG/0.5ML ~~LOC~~ SOAJ: 1.0000 mg | SUBCUTANEOUS | 0 refills | Status: DC

## 2022-01-10 MED ORDER — SEMAGLUTIDE-WEIGHT MANAGEMENT 0.25 MG/0.5ML ~~LOC~~ SOAJ: 0.2500 mg | SUBCUTANEOUS | 0 refills | Status: AC

## 2022-01-10 MED ORDER — SEMAGLUTIDE-WEIGHT MANAGEMENT 1.7 MG/0.75ML ~~LOC~~ SOAJ: 1.7000 mg | SUBCUTANEOUS | 0 refills | Status: DC

## 2022-01-10 MED ORDER — SEMAGLUTIDE-WEIGHT MANAGEMENT 0.5 MG/0.5ML ~~LOC~~ SOAJ: 0.5000 mg | SUBCUTANEOUS | 0 refills | Status: DC

## 2022-01-10 MED ORDER — SEMAGLUTIDE-WEIGHT MANAGEMENT 2.4 MG/0.75ML ~~LOC~~ SOAJ: 2.4000 mg | SUBCUTANEOUS | 0 refills | Status: DC

## 2022-01-10 NOTE — Telephone Encounter (Signed)
Spoke with patient.  She reports developing sore/blister near her knee about 1-2 weeks into starting Wegovy.  Has been applying neosporin, but site still there.  States has not broken the skin, but oozes some.  Is not a knot.  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  Unsure if whether this is what patient has or not.   ? ?Will have her hold [facility] for a couple of weeks until site clears up.  Once this is resolved will have her re-challenge with [facility], starting again with the 0.25 mg dose.  Patient agreeable ?

## 2022-01-10 NOTE — Telephone Encounter (Signed)
See other encounter.

## 2022-01-10 NOTE — Telephone Encounter (Signed)
YLTEIH pa approved routing to dr. Brion Aliment to see if she needs me to do anything else ? ?01/09/2022 - 08/11/2022 approval dates. ? ?

## 2022-01-12 NOTE — Telephone Encounter (Signed)
Left message to call back  ?Needs repeat CT and to start Crestor  ?

## 2022-01-27 NOTE — Telephone Encounter (Signed)
Patient never called back, discuss at follow up visit 5/15 ?

## 2022-02-06 ENCOUNTER — Ambulatory Visit (HOSPITAL_BASED_OUTPATIENT_CLINIC_OR_DEPARTMENT_OTHER): Payer: BC Managed Care – PPO | Admitting: Cardiovascular Disease

## 2022-02-06 VITALS — BP 110/78 | HR 57 | Ht 66.5 in | Wt 246.7 lb

## 2022-02-06 DIAGNOSIS — Z6839 Body mass index (BMI) 39.0-39.9, adult: Secondary | ICD-10-CM

## 2022-02-06 DIAGNOSIS — R001 Bradycardia, unspecified: Secondary | ICD-10-CM

## 2022-02-06 DIAGNOSIS — E7800 Pure hypercholesterolemia, unspecified: Secondary | ICD-10-CM | POA: Diagnosis not present

## 2022-02-06 DIAGNOSIS — Z5181 Encounter for therapeutic drug level monitoring: Secondary | ICD-10-CM | POA: Diagnosis not present

## 2022-02-06 DIAGNOSIS — R9389 Abnormal findings on diagnostic imaging of other specified body structures: Secondary | ICD-10-CM | POA: Diagnosis not present

## 2022-02-06 DIAGNOSIS — I2510 Atherosclerotic heart disease of native coronary artery without angina pectoris: Secondary | ICD-10-CM

## 2022-02-06 MED ORDER — ROSUVASTATIN CALCIUM 10 MG PO TABS: 10.0000 mg | ORAL_TABLET | Freq: Every day | ORAL | 3 refills | Status: DC

## 2022-02-06 NOTE — Progress Notes (Unsigned)
?Cardiology Office Note:   ? ?Date:  02/06/2022  ? ?ID:  Ashley Burns, DOB May 01, 1961, MRN [id number] ? ?PCP:  Glendale Chard, MD  ?Cardiologist:  None   ? ?Referring MD: Glendale Chard, MD  ? ?No chief complaint on file. ? ? ?History of Present Illness:   ? ?Ashley Burns is a 61 y.o. female with a hx of hypertension with pregnancy and RBBB here for follow-up.  She was seen 10/2021 for the evaluation of shortness of breath, palpitations, and family history of heart disease at the request of Dr. Baird Cancer.  She last saw Dr. Baird Cancer 07/2021 for annual physical exam. She complained of shortness of breath at rest and palpitations lasting a few months prior. She also endorsed a family history of heart disease on her father's side. She was referred to cardiology.  Her brother used crutches due to neonatal leg injuries and had various MIs and passed away in his 42s. Her father also had cardiovascular disease.  ?At her last appointment we discussed focusing on diet and exercise.  We also started her on Ozempic.  She got a very calcium score 11/2021 that revealed a score of 5, which was 75th percentile for age and gender. ? ?She hasn't had any issues with shortness of breath lately.   ?Working 12-14 hour days ?She had a sore on the R leg ? ? ?Past Medical History:  ?Diagnosis Date  ? Arthritis   ? Bundle branch block   ? Hypertension   ? with pregnancy only  ? Morbid obesity ([place]) 10/26/2021  ? Prediabetes 10/26/2021  ? Pure hypercholesterolemia 10/26/2021  ? RBBB 10/26/2021  ? Shortness of breath 10/26/2021  ? ? ?Past Surgical History:  ?Procedure Laterality Date  ? CESAREAN SECTION    ? x3  ? COLONOSCOPY    ? EXCISION MASS LOWER EXTREMETIES Right 05/26/2021  ? Procedure: EXCISION RIGHT THIGH MASS;  Surgeon: Coralie Keens, MD;  Location: [place];  Service: General;  Laterality: Right;  ? ? ?Current Medications: ?No outpatient medications have been marked as taking for the 02/06/22 encounter (Appointment) with Skeet Latch, MD.   ?  ? ?Allergies:   Shellfish allergy  ? ?Social History  ? ?Socioeconomic History  ? Marital status: Married  ?  Spouse name: Not on file  ? Number of children: Not on file  ? Years of education: Not on file  ? Highest education level: Not on file  ?Occupational History  ? Not on file  ?Tobacco Use  ? Smoking status: Never  ? Smokeless tobacco: Never  ?Vaping Use  ? Vaping Use: Never used  ?Substance and Sexual Activity  ? Alcohol use: Not Currently  ? Drug use: No  ? Sexual activity: Not on file  ?Other Topics Concern  ? Not on file  ?Social History Narrative  ? Not on file  ? ?Social Determinants of Health  ? ?Financial Resource Strain: Low Risk   ? Difficulty of Paying Living Expenses: Not hard at all  ?Food Insecurity: No Food Insecurity  ? Worried About Charity fundraiser in the Last Year: Never true  ? Ran Out of Food in the Last Year: Never true  ?Transportation Needs: No Transportation Needs  ? Lack of Transportation (Medical): No  ? Lack of Transportation (Non-Medical): No  ?Physical Activity: Inactive  ? Days of Exercise per Week: 0 days  ? Minutes of Exercise per Session: 0 min  ?Stress: Not on file  ?Social Connections: Not on file  ?  ? ?  Family History: ?The patient's family history includes Cancer in her cousin and mother; Diabetes in her father; Heart attack in her brother and father; Heart disease in her father; Hypertension in her father. There is no history of Colon cancer, Esophageal cancer, Stomach cancer, or Rectal cancer. ? ?ROS:   ?Please see the history of present illness.  ?(+) Shortness of breath ?(+) Chest discomfort ?(+) Palpitations ?(+) LE edema (bilateral ankles, R knee) ?(+) Weight gain    ?All other systems reviewed and negative.  ? ?EKGs/Labs/Other Studies Reviewed:   ? ?The following studies were reviewed today: ?Lower Extremity Duplex 09/05/12 ?- No evidence of deep vein thrombosis involving the  ?  visualized veins of the left lower extremity.  ?- No evidence of Baker's cyst on  the left.  ? ?Coronary Calcium score 11/2021:  ?IMPRESSION: ?1. Coronary calcium score of 5. This was 75th percentile for age, ?gender, and race matched controls. ? ?EKG:   ?10/26/21: Sinus bradycardia, rate 55 bpm; LAFB and RBBB ? ?Recent Labs: ?08/22/2021: ALT 24; BUN 16; Creatinine, Ser 0.63; Hemoglobin 13.4; Magnesium 1.8; Platelets 250; Potassium 4.2; Sodium 142; TSH 3.720  ? ?Recent Lipid Panel ?   ?Component Value Date/Time  ? CHOL 210 (H) 08/22/2021 0933  ? TRIG 70 08/22/2021 0933  ? HDL 53 08/22/2021 0933  ? CHOLHDL 4.0 08/22/2021 0933  ? [place] 144 (H) 08/22/2021 0933  ? ? ?Physical Exam:   ? ?VS:  LMP 11/06/2012  , BMI There is no height or weight on file to calculate BMI. ?GENERAL:  Well appearing ?HEENT: Pupils equal round and reactive, fundi not visualized, oral mucosa unremarkable ?NECK:  No jugular venous distention, waveform within normal limits, carotid upstroke brisk and symmetric, no bruits, no thyromegaly ?LUNGS:  Clear to auscultation bilaterally ?HEART:  Bradycardic.  PMI not displaced or sustained,S1 and S2 within normal limits, no S3, no S4, no clicks, no rubs, no murmurs ?ABD:  Flat, positive bowel sounds normal in frequency in pitch, no bruits, no rebound, no guarding, no midline pulsatile mass, no hepatomegaly, no splenomegaly ?EXT:  2 plus pulses throughout, trace edema, no cyanosis no clubbing ?SKIN:  No rashes no nodules ?NEURO:  Cranial nerves II through XII grossly intact, motor grossly intact throughout ?PSYCH:  Cognitively intact, oriented to person place and time ? ?ASSESSMENT:   ? ?No diagnosis found. ? ?PLAN:   ? ?No problem-specific Assessment & Plan notes found for this encounter. ?Shortness of breath ?Symptoms seems to have resolved and were not exertional.  We discussed that this may be a sign of sleep apnea or silent GERD.  She does not seem to have any other symptoms for sleep apnea so we will not pursue a sleep study at this time.  She notes that she does tend to eat late  at night and goes to sleep soon after.  She will work on this.  She does not otherwise have any GERD symptoms.  Will not start any medications at this time.  She is euvolemic on exam and has no exertional symptoms or murmurs on exam so we will not get an echo at this time.  She is going to work on increasing her exercise and should she have further symptoms we will reevaluate at follow-up. ?  ?Pure hypercholesterolemia ?Lipids are poorly controlled.  Her LDL was 144.  However her ASCVD 10-year risk is 4.4%.  She does have a family history of CAD, and premature CAD.  She is interested in getting a coronary  calcium score to better understand her risk.  For now she is going to work on increasing her exercise and improving her diet. ?  ?Morbid obesity ([place]) ?She is very motivated to make some changes.  We will refer her to healthy weight and wellness.  We also discussed some diet and exercise tips today.  Her goal is at least 150 minutes weekly.  She will work on increasing her protein and limiting carbohydrate intake.  She will also start Ozempic after a referral to our pharmacist.  She does have prediabetes so this should help. ?  ?Prediabetes ?Hemoglobin A1c was 5.7%.  Diet and exercise and starting Ozempic as above. ?  ?RBBB ?No clinical significance. ? ? ?Disposition: FU with Raquell Richer C. Oval Linsey, MD, [facility] [place] in 3 months ? ?Medication Adjustments/Labs and Tests Ordered: ?Current medicines are reviewed at length with the patient today.  Concerns regarding medicines are outlined above.  ?No orders of the defined types were placed in this encounter. ? ?No orders of the defined types were placed in this encounter. ? ? ?Signed, ?Skeet Latch, MD  ?02/06/2022 1:52 PM    ?[place] ? ?

## 2022-02-06 NOTE — Patient Instructions (Addendum)
Medication Instructions:  ?START ROSUVASTATIN 10 MG DAILY  ? ?*If you need a refill on your cardiac medications before your next appointment, please call your pharmacy* ? ?Lab Work: ?FASTING LP/CMET IN 2-3 MONTHS  ? ?If you have labs (blood work) drawn today and your tests are completely normal, you will receive your results only by: ?MyChart Message (if you have MyChart) OR ?A paper copy in the mail ?If you have any lab test that is abnormal or we need to change your treatment, we will call you to review the results. ? ?Testing/Procedures: ?NONE  ? ?Follow-Up: ?At [facility], you and your health needs are our priority.  As part of our continuing mission to provide you with exceptional heart care, we have created designated Provider Care Teams.  These Care Teams include your primary Cardiologist (physician) and Advanced Practice Providers (APPs -  Physician Assistants and Nurse Practitioners) who all work together to provide you with the care you need, when you need it. ? ?We recommend signing up for the patient portal called "MyChart".  Sign up information is provided on this After Visit Summary.  MyChart is used to connect with patients for Virtual Visits (Telemedicine).  Patients are able to view lab/test results, encounter notes, upcoming appointments, etc.  Non-urgent messages can be sent to your provider as well.   ?To learn more about what you can do with MyChart, go to [email or web address].   ? ?Your next appointment:   ?6 month(s) ? ?The format for your next appointment:   ?In Person ? ?Provider:   ?Skeet Latch, MD  ? ?You have been referred to  ? ?Where: [facility] ?Address: [place] [address] ?Phone: [phone] ? ?IF YOU DO NOT HEAR FROM THEM IN A FEW WEEKS CALL THEM DIRECTLY AT NUMBER LISTED  ? ? ? ? ? ? ?

## 2022-02-22 ENCOUNTER — Ambulatory Visit: Payer: BC Managed Care – PPO | Admitting: Internal Medicine

## 2022-02-22 ENCOUNTER — Encounter: Payer: Self-pay | Admitting: Internal Medicine

## 2022-02-22 VITALS — BP 124/80 | HR 64 | Temp 98.0°F | Ht 66.5 in | Wt 240.8 lb

## 2022-02-22 DIAGNOSIS — L309 Dermatitis, unspecified: Secondary | ICD-10-CM

## 2022-02-22 DIAGNOSIS — E6609 Other obesity due to excess calories: Secondary | ICD-10-CM

## 2022-02-22 DIAGNOSIS — R7309 Other abnormal glucose: Secondary | ICD-10-CM

## 2022-02-22 DIAGNOSIS — Z6838 Body mass index (BMI) 38.0-38.9, adult: Secondary | ICD-10-CM

## 2022-02-22 DIAGNOSIS — E7800 Pure hypercholesterolemia, unspecified: Secondary | ICD-10-CM | POA: Diagnosis not present

## 2022-02-22 MED ORDER — ROSUVASTATIN CALCIUM 20 MG PO TABS: ORAL_TABLET | ORAL | 2 refills | Status: DC

## 2022-02-22 NOTE — Progress Notes (Signed)
Rich Brave [place],acting as a Education administrator for Maximino Greenland, MD.,have documented all relevant documentation on the behalf of Maximino Greenland, MD,as directed by  Maximino Greenland, MD while in the presence of Maximino Greenland, MD.  This visit occurred during the SARS-CoV-2 public health emergency.  Safety protocols were in place, including screening questions prior to the visit, additional usage of staff PPE, and extensive cleaning of exam room while observing appropriate contact time as indicated for disinfecting solutions.  Subjective:     Patient ID: Ashley Burns , female    DOB: 05/23/1961 , 61 y.o.   MRN: [id number]   Chief Complaint  Patient presents with   Prediabetes    HPI  Patient presents today for a prediabetes check. Patient stated Dr. Oval Linsey prescribed her rosuvastatin, but didn't tell her when to start taking it. She has since researched the side effects, and decided she doesn't want to take it.   Knee Pain  The incident occurred more than 1 week ago. There was no injury mechanism. The pain is present in the right knee. The quality of the pain is described as aching. The pain is at a severity of 5/10. The pain is moderate. The pain has been Fluctuating since onset.    Past Medical History:  Diagnosis Date   Arthritis    Bundle branch block    Hypertension    with pregnancy only   Morbid obesity ([place]) 10/26/2021   Prediabetes 10/26/2021   Pure hypercholesterolemia 10/26/2021   RBBB 10/26/2021   Shortness of breath 10/26/2021     Family History  Problem Relation Age of Onset   Cancer Mother        breast   Heart attack Father    Diabetes Father    Hypertension Father    Heart disease Father    Heart attack Brother    Cancer Cousin        breast   Colon cancer Neg Hx    Esophageal cancer Neg Hx    Stomach cancer Neg Hx    Rectal cancer Neg Hx      Current Outpatient Medications:    BIOTIN PO, Take 2,500 mcg by mouth daily. Hair/skin/nails, Disp: , Rfl:     diclofenac Sodium (VOLTAREN) 1 % GEL, Apply 2 g topically 4 (four) times daily. (Patient taking differently: Apply 2 g topically daily as needed (Knee pain).), Disp: 100 g, Rfl: 2   Multiple Vitamin (MULTIVITAMIN ADULT PO), Take 1 tablet by mouth daily at 12 noon., Disp: , Rfl:    rosuvastatin (CRESTOR) 20 MG tablet, Please take MWF orally, Disp: 36 tablet, Rfl: 2   TURMERIC PO, Take 1 tablet by mouth daily., Disp: , Rfl:    zinc gluconate 50 MG tablet, Take 50 mg by mouth daily., Disp: , Rfl:    Allergies  Allergen Reactions   Shellfish Allergy     itching     Review of Systems  Constitutional: Negative.   Respiratory: Negative.    Cardiovascular: Negative.   Gastrointestinal: Negative.   Skin:  Positive for rash.       She c/o rash, thinks it is due to [facility]. States it initially looked a blister. Located on right leg.   Neurological: Negative.   Psychiatric/Behavioral: Negative.      Today's Vitals   02/22/22 0932  BP: 124/80  Pulse: 64  Temp: 98 F (36.7 C)  Weight: 240 lb 12.8 oz (109.2 kg)  Height: 5' 6.5" (1.689 m)  PainSc: 0-No pain   Body mass index is 38.28 kg/m.  Wt Readings from Last 3 Encounters:  02/22/22 240 lb 12.8 oz (109.2 kg)  02/06/22 246 lb 11.2 oz (111.9 kg)  12/06/21 244 lb (110.7 kg)     Objective:  Physical Exam Vitals and nursing note reviewed.  Constitutional:      Appearance: Normal appearance. She is obese.  HENT:     Head: Normocephalic and atraumatic.  Cardiovascular:     Rate and Rhythm: Normal rate and regular rhythm.     Heart sounds: Normal heart sounds.  Pulmonary:     Effort: Pulmonary effort is normal.     Breath sounds: Normal breath sounds.  Musculoskeletal:     Cervical back: Normal range of motion.  Skin:    General: Skin is warm.     Findings: Rash present.     Comments: Hyperpigmented rash medial to right knee, no vesicular lesion.   Neurological:     General: No focal deficit present.     Mental Status: She is  alert.  Psychiatric:        Mood and Affect: Mood normal.        Behavior: Behavior normal.        Assessment And Plan:     1. Pure hypercholesterolemia Comments: Chronic, she wishes to check chol level today. I will give further recommendations once labs have been reviewed.  - Amb Referral To Provider Referral Exercise Program (P.R.E.P) - TSH - Lipid panel  2. Other abnormal glucose Comments: Her a1c has been elevated in the past. I will recheck this today. She is encouraged to limit her intake of sweetened beverages and foods.  - Hemoglobin A1c - CMP14+EGFR  3. Dermatitis Comments: She agrees to [facility] referral. She wishes to discus other skin issues w/ them as well.  - Ambulatory referral to Dermatology  4. Class 2 obesity due to excess calories without serious comorbidity with body mass index (BMI) of 38.0 to 38.9 in adult Comments: She agrees to PREP referral. She is encouraged to aim for at least 150 minutes of exercise per week.  - Amb Referral To Provider Referral Exercise Program (P.R.E.P)   Patient was given opportunity to ask questions. Patient verbalized understanding of the plan and was able to repeat key elements of the plan. All questions were answered to their satisfaction.   I, Maximino Greenland, MD, have reviewed all documentation for this visit. The documentation on 02/22/22 for the exam, diagnosis, procedures, and orders are all accurate and complete.   IF YOU HAVE BEEN REFERRED TO A SPECIALIST, IT MAY TAKE 1-2 WEEKS TO SCHEDULE/PROCESS THE REFERRAL. IF YOU HAVE NOT HEARD FROM US/SPECIALIST IN TWO WEEKS, PLEASE GIVE [place] A CALL AT [phone].   THE PATIENT IS ENCOURAGED TO PRACTICE SOCIAL DISTANCING DUE TO THE COVID-19 PANDEMIC.

## 2022-02-22 NOTE — Patient Instructions (Signed)

## 2022-02-23 LAB — CMP14+EGFR
ALT: 19 IU/L (ref 0–32)
AST: 20 IU/L (ref 0–40)
Albumin/Globulin Ratio: 1.5 (ref 1.2–2.2)
Albumin: 4.3 g/dL (ref 3.8–4.9)
Alkaline Phosphatase: 78 IU/L (ref 44–121)
BUN/Creatinine Ratio: 17 (ref 12–28)
BUN: 16 mg/dL (ref 8–27)
Bilirubin Total: 0.5 mg/dL (ref 0.0–1.2)
CO2: 22 mmol/L (ref 20–29)
Calcium: 9.9 mg/dL (ref 8.7–10.3)
Chloride: 106 mmol/L (ref 96–106)
Creatinine, Ser: 0.94 mg/dL (ref 0.57–1.00)
Globulin, Total: 2.9 g/dL (ref 1.5–4.5)
Glucose: 92 mg/dL (ref 70–99)
Potassium: 4.5 mmol/L (ref 3.5–5.2)
Sodium: 143 mmol/L (ref 134–144)
Total Protein: 7.2 g/dL (ref 6.0–8.5)
eGFR: 69 mL/min/{1.73_m2} (ref >59)

## 2022-02-23 LAB — LIPID PANEL
Chol/HDL Ratio: 4.3 ratio (ref 0.0–4.4)
Cholesterol, Total: 216 mg/dL — ABNORMAL HIGH (ref 100–199)
HDL: 50 mg/dL (ref >39)
LDL Chol Calc (NIH): 147 mg/dL — ABNORMAL HIGH (ref 0–99)
Triglycerides: 106 mg/dL (ref 0–149)
VLDL Cholesterol Cal: 19 mg/dL (ref 5–40)

## 2022-02-23 LAB — HEMOGLOBIN A1C
Est. average glucose Bld gHb Est-mCnc: 114 mg/dL
Hgb A1c MFr Bld: 5.6 % (ref 4.8–5.6)

## 2022-02-23 LAB — TSH: TSH: 3.48 u[IU]/mL (ref 0.450–4.500)

## 2022-02-24 ENCOUNTER — Telehealth: Payer: Self-pay

## 2022-02-24 NOTE — Telephone Encounter (Signed)
Called to discuss PREP program; she wants to attend next class at [facility] starting June 20, every T/Th 8-9:15; also wants her daughter to attend, will await referral; will contact next week to set up assessment visit.

## 2022-03-02 ENCOUNTER — Other Ambulatory Visit (HOSPITAL_BASED_OUTPATIENT_CLINIC_OR_DEPARTMENT_OTHER): Payer: Self-pay

## 2022-03-02 ENCOUNTER — Ambulatory Visit (INDEPENDENT_AMBULATORY_CARE_PROVIDER_SITE_OTHER): Payer: BC Managed Care – PPO | Admitting: Pharmacist Clinician (PhC)/ Clinical Pharmacy Specialist

## 2022-03-02 DIAGNOSIS — E669 Obesity, unspecified: Secondary | ICD-10-CM

## 2022-03-02 MED ORDER — SEMAGLUTIDE-WEIGHT MANAGEMENT 1 MG/0.5ML ~~LOC~~ SOAJ
1.0000 mg | SUBCUTANEOUS | 1 refills | Status: DC
  Filled 2022-03-02: qty 2, fill #0
  Filled 2022-05-19 – 2022-06-05 (×2): qty 2, 28d supply, fill #0

## 2022-03-02 MED ORDER — SEMAGLUTIDE-WEIGHT MANAGEMENT 0.5 MG/0.5ML ~~LOC~~ SOAJ
0.5000 mg | SUBCUTANEOUS | 1 refills | Status: DC
  Filled 2022-03-02: qty 2, 28d supply, fill #0
  Filled 2022-05-03: qty 2, 28d supply, fill #1

## 2022-03-02 MED ORDER — SEMAGLUTIDE-WEIGHT MANAGEMENT 2.4 MG/0.75ML ~~LOC~~ SOAJ
2.4000 mg | SUBCUTANEOUS | 1 refills | Status: DC
  Filled 2022-03-02: qty 3, fill #0
  Filled 2022-09-07: qty 3, 28d supply, fill #0

## 2022-03-02 MED ORDER — SEMAGLUTIDE-WEIGHT MANAGEMENT 1.7 MG/0.75ML ~~LOC~~ SOAJ
1.7000 mg | SUBCUTANEOUS | 1 refills | Status: DC
  Filled 2022-03-02: qty 3, fill #0
  Filled 2022-07-17: qty 3, 28d supply, fill #0
  Filled 2022-08-11 (×2): qty 3, 28d supply, fill #1

## 2022-03-02 NOTE — Patient Instructions (Signed)
   TIPS FOR SUCCESS Write down the reasons why you want to lose weight and post it in a place where you'll see it often. Start small and work your way up. Keep in mind that it takes time to achieve goals, and small steps add up. Any additional movements help to burn calories. Taking the stairs rather than the elevator and parking at the far end of your parking lot are easy ways to start. Brisk walking for at least 30 minutes 4 or more days of the week is an excellent goal to work toward  [place] Did you know that it can take 15 minutes or more for your brain to receive the message that you've eaten? That means that, if you eat less food, but consume it slower, you may still feel satisfied. Eating a lot of fruits and vegetables can also help you feel fuller. Eat off of smaller plates so that moderate portions don't seem too small  TITRATION PLAN Will plan to follow the titration plan as below, pending patient is tolerating each dose before increasing to the next. Can slow titration if needed for tolerability.    -Weeks 1-4: Inject 0.25 mg SQ once weekly  -Weeks 5-8: Inject 0.5 mg SQ once weekly  -Weeks 9-12 Inject 1 mg SQ once weekly  -Weeks 13-16: Inject 1.7 SQ once weekly  Thereafter inject 2.4 mg SQ once weekly  Follow up in September.   If you have any questions or concerns, please reach out to [place].  Jamaar Howes/Chris at [phone].  [place]

## 2022-03-02 NOTE — Progress Notes (Unsigned)
HPI: Ashley Burns is a 61 y.o. female patient referred to pharmacy clinic by Dr. Oval Linsey to initiate weight loss therapy with GLP1-RA.  Most recent BMI 38.8 today.  Patient notes that her work keeps her busy and she notes a 10-20 pound weight gain in the past year.  She was started on Wegovy but stopped after just a couple of doses because of a sore that developed near her knee.  She was advised to hold until area was cleared up then could re-challenge.  Once the area was mostly cleared up she took the final 2 doses of 0.25 mg, but has not had any since.  She notes the area is still off color and has appointment with dermatology soon.  Mancel Parsons does list bullous pemphigoid as side effect, but unsure as her picture of sore did not match.    Today her weight is unchanged from last visit.  She would like to re-start medication, discussed current shortages from manufacturer.    Significant medical history: hyperlipidemia 11/22 LDL 144 - no medication  Pre-diabetes 11/22 A1c 5.7 - no medication, working on weight/lifestyle   Current weight management medications: none  Previously tried meds: none  Current meds that may affect weight: none  Baseline weight/BMI: 38.8  Insurance payor: Art therapist  Diet: since last visit, has been reading labels, stopped fried foods (especially [place] fries), sides now are vegetables, fruits; less bread  Exercise: started back to walking walking 30 minutes twice weekly  Family History: brother had MI, died at 79; father with heart disease, mother lived to 63; 2 children healthy  Confirmed patient not pregnant and no personal or family history of medullary thyroid carcinoma (MTC) or Multiple Endocrine Neoplasia syndrome type 2 (MEN 2).    Labs: Lab Results  Component Value Date   HGBA1C 5.6 02/22/2022    Wt Readings from Last 1 Encounters:  03/02/22 243 lb (110.2 kg)    BP Readings from Last 1 Encounters:  03/02/22 121/79   Pulse Readings from  Last 1 Encounters:  03/02/22 60       Component Value Date/Time   CHOL 216 (H) 02/22/2022 1009   TRIG 106 02/22/2022 1009   HDL 50 02/22/2022 1009   CHOLHDL 4.3 02/22/2022 1009   [place] 147 (H) 02/22/2022 1009    Past Medical History:  Diagnosis Date   Arthritis    Bundle branch block    Hypertension    with pregnancy only   Morbid obesity ([place]) 10/26/2021   Prediabetes 10/26/2021   Pure hypercholesterolemia 10/26/2021   RBBB 10/26/2021   Shortness of breath 10/26/2021    Current Outpatient Medications on File Prior to Visit  Medication Sig Dispense Refill   BIOTIN PO Take 2,500 mcg by mouth daily. Hair/skin/nails     diclofenac Sodium (VOLTAREN) 1 % GEL Apply 2 g topically 4 (four) times daily. (Patient taking differently: Apply 2 g topically daily as needed (Knee pain).) 100 g 2   Multiple Vitamin (MULTIVITAMIN ADULT PO) Take 1 tablet by mouth daily at 12 noon.     TURMERIC PO Take 1 tablet by mouth daily.     zinc gluconate 50 MG tablet Take 50 mg by mouth daily.     rosuvastatin (CRESTOR) 20 MG tablet Please take MWF orally (Patient not taking: Reported on 03/02/2022) 36 tablet 2   No current facility-administered medications on file prior to visit.    Allergies  Allergen Reactions   Shellfish Allergy     itching  Obesity Patient has not met goal of at least 5% of body weight loss with comprehensive lifestyle modifications alone in the past 3-6 months. Pharmacotherapy is appropriate to pursue as augmentation. Will re-start Wegovy.  She understands concerns for product shortages and the concerns of starting/stopping in relation to GI distress.    Confirmed patient not pregnant and no personal or family history of medullary thyroid carcinoma (MTC) or Multiple Endocrine Neoplasia syndrome type 2 (MEN 2).   Advised patient on common side effects including nausea, diarrhea, dyspepsia, decreased appetite, and fatigue. Counseled patient on reducing meal size and how to titrate  medication to minimize side effects. Patient aware to call if intolerable side effects or if experiencing dehydration, abdominal pain, or dizziness. Patient will adhere to dietary modifications and will target at least 150 minutes of moderate intensity exercise weekly.   Titration Plan:  Will plan to follow the titration plan as below, pending patient is tolerating each dose before increasing to the next. Can slow titration if needed for tolerability.    -Month 1: Inject 0.25 mg SQ once weekly x 4 weeks -Month 2: Inject 0.5 mg SQ once weekly x 4 weeks -Month 3: Inject 1 mg SQ once weekly x 4 weeks -Month 4+: Inject 1.7 mg SQ once weekly   Follow up in 3 months.   Tommy Medal PharmD CPP [place]

## 2022-03-04 ENCOUNTER — Encounter: Payer: Self-pay | Admitting: Pharmacist Clinician (PhC)/ Clinical Pharmacy Specialist

## 2022-03-04 NOTE — Assessment & Plan Note (Addendum)
Patient has not met goal of at least 5% of body weight loss with comprehensive lifestyle modifications alone in the past 3-6 months. Pharmacotherapy is appropriate to pursue as augmentation. Will re-start Wegovy.  She understands concerns for product shortages and the concerns of starting/stopping in relation to GI distress.    Confirmed patient not pregnant and no personal or family history of medullary thyroid carcinoma (MTC) or Multiple Endocrine Neoplasia syndrome type 2 (MEN 2).   Advised patient on common side effects including nausea, diarrhea, dyspepsia, decreased appetite, and fatigue. Counseled patient on reducing meal size and how to titrate medication to minimize side effects. Patient aware to call if intolerable side effects or if experiencing dehydration, abdominal pain, or dizziness. Patient will adhere to dietary modifications and will target at least 150 minutes of moderate intensity exercise weekly.   Titration Plan:  Will plan to follow the titration plan as below, pending patient is tolerating each dose before increasing to the next. Can slow titration if needed for tolerability.    -Month 1: Inject 0.25 mg SQ once weekly x 4 weeks -Month 2: Inject 0.5 mg SQ once weekly x 4 weeks -Month 3: Inject 1 mg SQ once weekly x 4 weeks -Month 4+: Inject 1.7 mg SQ once weekly   Follow up in 3 months.

## 2022-03-06 ENCOUNTER — Other Ambulatory Visit (HOSPITAL_BASED_OUTPATIENT_CLINIC_OR_DEPARTMENT_OTHER): Payer: Self-pay

## 2022-03-07 ENCOUNTER — Telehealth: Payer: Self-pay

## 2022-03-07 NOTE — Telephone Encounter (Signed)
Called to confirm participation in next PREP class on June 20 at [facility] and to set up assessment visit; left voicemail; also offered next classes in July/August at [place]

## 2022-03-18 ENCOUNTER — Encounter (HOSPITAL_BASED_OUTPATIENT_CLINIC_OR_DEPARTMENT_OTHER): Payer: Self-pay | Admitting: Cardiovascular Disease

## 2022-03-18 DIAGNOSIS — I2510 Atherosclerotic heart disease of native coronary artery without angina pectoris: Secondary | ICD-10-CM

## 2022-03-18 HISTORY — DX: Atherosclerotic heart disease of native coronary artery without angina pectoris: I25.10

## 2022-03-18 NOTE — Assessment & Plan Note (Signed)
Asymptomatic. 

## 2022-03-18 NOTE — Assessment & Plan Note (Signed)
Starting rosuvastatin as above.  Limit fried foods, fatty foods, red meat, and cheese.

## 2022-05-03 ENCOUNTER — Other Ambulatory Visit (HOSPITAL_BASED_OUTPATIENT_CLINIC_OR_DEPARTMENT_OTHER): Payer: Self-pay

## 2022-05-04 ENCOUNTER — Other Ambulatory Visit (HOSPITAL_BASED_OUTPATIENT_CLINIC_OR_DEPARTMENT_OTHER): Payer: Self-pay

## 2022-05-19 ENCOUNTER — Encounter (HOSPITAL_BASED_OUTPATIENT_CLINIC_OR_DEPARTMENT_OTHER): Payer: Self-pay | Admitting: Obstetrics and Gynecology

## 2022-05-19 ENCOUNTER — Other Ambulatory Visit (HOSPITAL_BASED_OUTPATIENT_CLINIC_OR_DEPARTMENT_OTHER): Payer: Self-pay

## 2022-05-19 ENCOUNTER — Other Ambulatory Visit: Payer: Self-pay | Admitting: Obstetrics and Gynecology

## 2022-05-19 ENCOUNTER — Other Ambulatory Visit: Payer: Self-pay

## 2022-05-19 NOTE — Progress Notes (Signed)
Spoke w/ via phone for pre-op interview---pt Lab needs dos----cbc per surgeon orders (orders need 2nd sign)               Lab results------ COVID test -----patient states asymptomatic no test needed Arrive at -------830 am 05-26-2022 NPO after MN NO Solid Food.  Clear liquids from MN until---730 am Med rec completed Medications to take morning of surgery -----Rosuvastatin Diabetic medication -----n/a Patient instructed no nail polish to be worn day of surgery Patient instructed to bring photo id and insurance card day of surgery Patient aware to have Driver (ride ) / caregiver   daughter Myrle Sheng [place]   for 24 hours after surgery  Patient Special Instructions -----hold 05-23-2022 dose of wegovy(semaglutide) per anesthesia guidelines Pre-Op special Istructions -----none Patient verbalized understanding of instructions that were given at this phone interview. Patient denies shortness of breath, chest pain, fever, cough at this phone interview.   [facility] cardiology t  02-06-2022 epic Ekg 10-26-2021 chart/epic Ct cardiac scoring 12-14-2021 Chest ct 3-22-203

## 2022-05-23 ENCOUNTER — Telehealth (HOSPITAL_BASED_OUTPATIENT_CLINIC_OR_DEPARTMENT_OTHER): Payer: Self-pay | Admitting: Cardiovascular Disease

## 2022-05-23 NOTE — Telephone Encounter (Signed)
RN called patient and provided the following recommendations. Patient verbalizes  understanding.     "Hold Ozempic one week prior to procedure as recommended by anesthesiology. May resume next week on her usual day.    Loel Dubonnet, NP '

## 2022-05-23 NOTE — Telephone Encounter (Signed)
Pt c/o medication issue:  1. Name of Medication:   Semaglutide-Weight Management 1 MG/0.5ML SOAJ   2. How are you currently taking this medication (dosage and times per day)? Not currently taking   3. Are you having a reaction (difficulty breathing--STAT)? No   4. What is your medication issue? Patient is calling stating her gynecology office advised her to hold this medication for her procedure on 09/01. She states she was due for her injection today, but hasn't taken due to being told to hold it for a week. She is requesting a callback to discuss when she should start back on it. Please advise.

## 2022-05-23 NOTE — Telephone Encounter (Signed)
Hold Ozempic one week prior to procedure as recommended by anesthesiology. May resume next week on her usual day.   Loel Dubonnet, NP

## 2022-05-24 ENCOUNTER — Encounter: Payer: Self-pay | Admitting: Pharmacist Clinician (PhC)/ Clinical Pharmacy Specialist

## 2022-05-25 NOTE — Anesthesia Preprocedure Evaluation (Addendum)
Anesthesia Evaluation  Patient identified by MRN, date of birth, ID band Patient awake    Reviewed: Allergy & Precautions, NPO status , Patient's Chart, lab work & pertinent test results  Airway Mallampati: II  TM Distance: >3 FB Neck ROM: Full    Dental no notable dental hx. (+) Teeth Intact, Dental Advisory Given   Pulmonary neg pulmonary ROS,    Pulmonary exam normal breath sounds clear to auscultation       Cardiovascular hypertension, negative cardio ROS Normal cardiovascular exam Rhythm:Regular Rate:Normal     Neuro/Psych negative neurological ROS  negative psych ROS   GI/Hepatic negative GI ROS, Neg liver ROS,   Endo/Other  negative endocrine ROSdiabetes  Renal/GU negative Renal ROS  negative genitourinary   Musculoskeletal negative musculoskeletal ROS (+) Arthritis ,   Abdominal (+) + obese (BMI 37.1),   Peds negative pediatric ROS (+)  Hematology negative hematology ROS (+)   Anesthesia Other Findings   Reproductive/Obstetrics negative OB ROS                            Anesthesia Physical Anesthesia Plan  ASA: 2  Anesthesia Plan:    Post-op Pain Management: Toradol IV (intra-op)*, Tylenol PO (pre-op)* and Minimal or no pain anticipated   Induction:   PONV Risk Score and Plan: Treatment may vary due to age or medical condition, Midazolam, Ondansetron and Dexamethasone  Airway Management Planned: LMA  Additional Equipment: None  Intra-op Plan:   Post-operative Plan:   Informed Consent: I have reviewed the patients History and Physical, chart, labs and discussed the procedure including the risks, benefits and alternatives for the proposed anesthesia with the patient or authorized representative who has indicated his/her understanding and acceptance.     Dental advisory given  Plan Discussed with:   Anesthesia Plan Comments: (Pt on Ozempic last dose 8/22)        Anesthesia Quick Evaluation

## 2022-05-26 ENCOUNTER — Other Ambulatory Visit: Payer: Self-pay

## 2022-05-26 ENCOUNTER — Encounter (HOSPITAL_BASED_OUTPATIENT_CLINIC_OR_DEPARTMENT_OTHER)
Admission: RE | Disposition: A | Discharge status: Home / Self Care | Payer: Self-pay | Source: Home / Self Care | Attending: Obstetrics and Gynecology

## 2022-05-26 ENCOUNTER — Ambulatory Visit (HOSPITAL_BASED_OUTPATIENT_CLINIC_OR_DEPARTMENT_OTHER)
Admission: RE | Admit: 2022-05-26 | Discharge: 2022-05-26 | Disposition: A | Discharge status: Home / Self Care | Payer: BC Managed Care – PPO | Attending: Obstetrics and Gynecology | Admitting: Obstetrics and Gynecology

## 2022-05-26 ENCOUNTER — Ambulatory Visit (HOSPITAL_BASED_OUTPATIENT_CLINIC_OR_DEPARTMENT_OTHER): Payer: BC Managed Care – PPO | Admitting: Anesthesiology

## 2022-05-26 ENCOUNTER — Encounter (HOSPITAL_BASED_OUTPATIENT_CLINIC_OR_DEPARTMENT_OTHER): Payer: Self-pay | Admitting: Obstetrics and Gynecology

## 2022-05-26 DIAGNOSIS — I10 Essential (primary) hypertension: Secondary | ICD-10-CM | POA: Insufficient documentation

## 2022-05-26 DIAGNOSIS — N840 Polyp of corpus uteri: Secondary | ICD-10-CM | POA: Diagnosis not present

## 2022-05-26 DIAGNOSIS — Z01818 Encounter for other preprocedural examination: Secondary | ICD-10-CM

## 2022-05-26 DIAGNOSIS — N950 Postmenopausal bleeding: Secondary | ICD-10-CM | POA: Diagnosis present

## 2022-05-26 DIAGNOSIS — Z6837 Body mass index (BMI) 37.0-37.9, adult: Secondary | ICD-10-CM | POA: Insufficient documentation

## 2022-05-26 HISTORY — DX: Presence of spectacles and contact lenses: Z97.3

## 2022-05-26 HISTORY — DX: Postmenopausal bleeding: N95.0

## 2022-05-26 HISTORY — PX: DILATATION & CURETTAGE/HYSTEROSCOPY WITH MYOSURE: SHX6511

## 2022-05-26 LAB — CBC
HCT: 40.8 % (ref 36.0–46.0)
Hemoglobin: 13.2 g/dL (ref 12.0–15.0)
MCH: 29.3 pg (ref 26.0–34.0)
MCHC: 32.4 g/dL (ref 30.0–36.0)
MCV: 90.5 fL (ref 80.0–100.0)
Platelets: 225 10*3/uL (ref 150–400)
RBC: 4.51 MIL/uL (ref 3.87–5.11)
RDW: 13.5 % (ref 11.5–15.5)
WBC: 6.7 10*3/uL (ref 4.0–10.5)
nRBC: 0 % (ref 0.0–0.2)

## 2022-05-26 SURGERY — DILATATION & CURETTAGE/HYSTEROSCOPY WITH MYOSURE
Anesthesia: General | Site: Vagina

## 2022-05-26 MED ORDER — LIDOCAINE HCL (PF) 2 % IJ SOLN
INTRAMUSCULAR | Status: AC
  Filled 2022-05-26: qty 5

## 2022-05-26 MED ORDER — LIDOCAINE 2% (20 MG/ML) 5 ML SYRINGE
INTRAMUSCULAR | Status: DC | PRN
  Administered 2022-05-26: 90 mg via INTRAVENOUS

## 2022-05-26 MED ORDER — EPHEDRINE SULFATE-NACL 50-0.9 MG/10ML-% IV SOSY
PREFILLED_SYRINGE | INTRAVENOUS | Status: DC | PRN
  Administered 2022-05-26: 10 mg via INTRAVENOUS
  Administered 2022-05-26: 15 mg via INTRAVENOUS

## 2022-05-26 MED ORDER — FENTANYL CITRATE (PF) 100 MCG/2ML IJ SOLN
INTRAMUSCULAR | Status: DC | PRN
  Administered 2022-05-26: 50 ug via INTRAVENOUS

## 2022-05-26 MED ORDER — ONDANSETRON HCL 4 MG/2ML IJ SOLN
4.0000 mg | Freq: Once | INTRAMUSCULAR | Status: DC | PRN
Start: 2022-05-26 — End: 2022-05-26

## 2022-05-26 MED ORDER — KETOROLAC TROMETHAMINE 30 MG/ML IJ SOLN
INTRAMUSCULAR | Status: AC
  Filled 2022-05-26: qty 1

## 2022-05-26 MED ORDER — OXYCODONE HCL 5 MG/5ML PO SOLN: 5.0000 mg | Freq: Once | ORAL | Status: DC | PRN

## 2022-05-26 MED ORDER — ONDANSETRON HCL 4 MG/2ML IJ SOLN
INTRAMUSCULAR | Status: AC
  Filled 2022-05-26: qty 2

## 2022-05-26 MED ORDER — KETOROLAC TROMETHAMINE 30 MG/ML IJ SOLN
INTRAMUSCULAR | Status: DC | PRN
  Administered 2022-05-26: 30 mg via INTRAVENOUS

## 2022-05-26 MED ORDER — POVIDONE-IODINE 10 % EX SWAB: 2.0000 | Freq: Once | CUTANEOUS | Status: DC

## 2022-05-26 MED ORDER — DEXAMETHASONE SODIUM PHOSPHATE 10 MG/ML IJ SOLN
INTRAMUSCULAR | Status: AC
  Filled 2022-05-26: qty 1

## 2022-05-26 MED ORDER — EPHEDRINE 5 MG/ML INJ
INTRAVENOUS | Status: AC
  Filled 2022-05-26: qty 5

## 2022-05-26 MED ORDER — HYDROMORPHONE HCL 1 MG/ML IJ SOLN: 0.2500 mg | INTRAMUSCULAR | Status: DC | PRN

## 2022-05-26 MED ORDER — SODIUM CHLORIDE 0.9 % IR SOLN
Status: DC | PRN
  Administered 2022-05-26: 3000 mL

## 2022-05-26 MED ORDER — KETOROLAC TROMETHAMINE 30 MG/ML IJ SOLN
30.0000 mg | Freq: Once | INTRAMUSCULAR | Status: DC | PRN
Start: 2022-05-26 — End: 2022-05-26

## 2022-05-26 MED ORDER — LACTATED RINGERS IV SOLN: INTRAVENOUS | Status: DC

## 2022-05-26 MED ORDER — ONDANSETRON HCL 4 MG/2ML IJ SOLN
INTRAMUSCULAR | Status: DC | PRN
  Administered 2022-05-26: 4 mg via INTRAVENOUS

## 2022-05-26 MED ORDER — FENTANYL CITRATE (PF) 100 MCG/2ML IJ SOLN
INTRAMUSCULAR | Status: AC
  Filled 2022-05-26: qty 2

## 2022-05-26 MED ORDER — PROPOFOL 10 MG/ML IV BOLUS
INTRAVENOUS | Status: DC | PRN
  Administered 2022-05-26: 150 mg via INTRAVENOUS

## 2022-05-26 MED ORDER — MIDAZOLAM HCL 2 MG/2ML IJ SOLN
INTRAMUSCULAR | Status: DC | PRN
  Administered 2022-05-26: 2 mg via INTRAVENOUS

## 2022-05-26 MED ORDER — DEXAMETHASONE SODIUM PHOSPHATE 10 MG/ML IJ SOLN
INTRAMUSCULAR | Status: DC | PRN
  Administered 2022-05-26: 10 mg via INTRAVENOUS

## 2022-05-26 MED ORDER — PROPOFOL 10 MG/ML IV BOLUS
INTRAVENOUS | Status: AC
  Filled 2022-05-26: qty 20

## 2022-05-26 MED ORDER — OXYCODONE HCL 5 MG PO TABS: 5.0000 mg | ORAL_TABLET | Freq: Once | ORAL | Status: DC | PRN

## 2022-05-26 MED ORDER — MIDAZOLAM HCL 2 MG/2ML IJ SOLN
INTRAMUSCULAR | Status: AC
  Filled 2022-05-26: qty 2

## 2022-05-26 SURGICAL SUPPLY — 17 items
DEVICE MYOSURE REACH (MISCELLANEOUS) IMPLANT
GLOVE BIOGEL PI IND STRL 6 (GLOVE) IMPLANT
GLOVE BIOGEL PI IND STRL 7.0 (GLOVE) ×1 IMPLANT
GLOVE BIOGEL PI INDICATOR 6 (GLOVE) ×3
GLOVE BIOGEL PI INDICATOR 7.0 (GLOVE) ×2
GLOVE ECLIPSE 6.0 STRL STRAW (GLOVE) IMPLANT
GLOVE ECLIPSE 6.5 STRL STRAW (GLOVE) ×1 IMPLANT
GOWN STRL REUS W/TWL LRG LVL3 (GOWN DISPOSABLE) ×1 IMPLANT
IV NS IRRIG 3000ML ARTHROMATIC (IV SOLUTION) ×1 IMPLANT
KIT PROCEDURE FLUENT (KITS) ×1 IMPLANT
KIT TURNOVER CYSTO (KITS) ×1 IMPLANT
PACK VAGINAL MINOR WOMEN LF (CUSTOM PROCEDURE TRAY) ×1 IMPLANT
PAD OB MATERNITY 4.3X12.25 (PERSONAL CARE ITEMS) ×1 IMPLANT
PAD PREP 24X48 CUFFED NSTRL (MISCELLANEOUS) ×1 IMPLANT
SEAL ROD LENS SCOPE MYOSURE (ABLATOR) ×1 IMPLANT
SOL PREP POV-IOD 4OZ 10% (MISCELLANEOUS) IMPLANT
TOWEL OR 17X26 10 PK STRL BLUE (TOWEL DISPOSABLE) ×1 IMPLANT

## 2022-05-26 NOTE — Discharge Instructions (Addendum)
CALL  IF TEMP>100.4, NOTHING PER VAGINA X 1 WK, CALL IF SOAKING A MAXI  PAD EVERY HOUR OR MORE FREQUENTLY     No ibuprofen, Advil, Aleve, Motrin, ketorolac, meloxicam, naproxen, or other NSAIDS until after 4:30 pm today if needed.       Post Anesthesia Home Care Instructions  Activity: Get plenty of rest for the remainder of the day. A responsible individual must stay with you for 24 hours following the procedure.  For the next 24 hours, DO NOT: -Drive a car -Paediatric nurse -Drink alcoholic beverages -Take any medication unless instructed by your physician -Make any legal decisions or sign important papers.  Meals: Start with liquid foods such as gelatin or soup. Progress to regular foods as tolerated. Avoid greasy, spicy, heavy foods. If nausea and/or vomiting occur, drink only clear liquids until the nausea and/or vomiting subsides. Call your physician if vomiting continues.  Special Instructions/Symptoms: Your throat may feel dry or sore from the anesthesia or the breathing tube placed in your throat during surgery. If this causes discomfort, gargle with warm salt water. The discomfort should disappear within 24 hours.

## 2022-05-26 NOTE — Op Note (Signed)
Ashley Burns, Ashley Burns MEDICAL RECORD NO: [id number] ACCOUNT NO: [id number] DATE OF BIRTH: 09/06/1961 FACILITY: [facility] LOCATION: [facility] PHYSICIAN: Markiyah Gahm A. Garwin Brothers, MD  Operative Report   DATE OF PROCEDURE: 05/26/2022  PREOPERATIVE DIAGNOSES:   1.  Postmenopausal bleeding. 2.  Endometrial polyps.  PROCEDURE:  Diagnostic hysteroscopy, hysteroscopic resection of endometrial polyps, dilation and curettage.  POSTOPERATIVE DIAGNOSES:   1.  Postmenopausal bleeding. 2.  Endometrial polyps.  ANESTHESIA:  General.  SURGEON:  Habib Kise A. Garwin Brothers, MD.  ASSISTANT:  None.  DESCRIPTION OF PROCEDURE:  Under adequate general anesthesia, the patient was placed in the dorsal lithotomy position.  She was sterilely prepped and draped in the usual fashion.  The patient had voided prior to entering the room and therefore, she was  not catheterized.  Examination under anesthesia revealed a 10-week bulky uterus.  No adnexal masses could be appreciated.  Bivalve speculum was placed in the vagina.  A single-tooth tenaculum was placed on the anterior lip of the cervix.  The cervix was  then serially dilated up to #19 [facility] dilator.  A diagnostic hysteroscope was introduced into the uterine cavity. At the anterior lower uterine segment was a broad-based polypoid lesion. Further into the cavity, the right tubal ostia could be seen, the  left was not seen due to a polyp that had extended from the posterior to the anterior wall and a little bit laterally. Using the Reach resectoscope, the upper polypoid lesion was resected followed by the endometrial canal and then the lower uterine  segment polypoid lesion was also removed. On exiting the cavity, the endocervical canal was inspected.  Two small polypoid lesions were noted and they were also removed. When all tissue was felt to have been removed, all  instruments were then removed from the vagina.  Specimen labeled endometrial curettings with polyps were  sent to Pathology.  ESTIMATED BLOOD LOSS:  2 mL   FLUID DEFICIT:  881 mL  COMPLICATIONS:  None.  The patient tolerated the procedure well and was transferred to recovery room in stable condition.     PAA D: 05/26/2022 10:51:27 am T: 05/26/2022 11:15:00 am  JOB: [id number]/ [id number]

## 2022-05-26 NOTE — Brief Op Note (Signed)
05/26/2022  10:52 AM  PATIENT:  Ashley Burns  61 y.o. female  PRE-OPERATIVE DIAGNOSIS:  post menopausal bleeding  Endometrial polyps  POST-OPERATIVE DIAGNOSIS:  post menopausal bleeding  endometrial endometrial polyps  PROCEDURE:  diagnostic hysteroscopy, D&C, hysteroscopic resection of endometrial polyps using myosure  SURGEON:  Surgeon(s) and Role:    * Servando Salina, MD - Primary  PHYSICIAN ASSISTANT:   ASSISTANTS: none   ANESTHESIA:   general FINDINGS: MULTIPLE POLYPOID LESIONS, TUBAL OSTIA SEEN  r>l , ENDOCERVICAL CANAL SMALL POLYP EBL:  5 mL   BLOOD ADMINISTERED:none  DRAINS: none   LOCAL MEDICATIONS USED:  NONE  SPECIMEN:  Source of Specimen:  emc with endometrial polyp  DISPOSITION OF SPECIMEN:  PATHOLOGY  COUNTS:  YES  TOURNIQUET:  * No tourniquets in log *  DICTATION: .Other Dictation: Dictation Number [id number]  PLAN OF CARE: Discharge to home after PACU  PATIENT DISPOSITION:  PACU - hemodynamically stable.   Delay start of Pharmacological VTE agent (>24hrs) due to surgical blood loss or risk of bleeding: no

## 2022-05-26 NOTE — Anesthesia Procedure Notes (Addendum)
Procedure Name: LMA Insertion Date/Time: 05/26/2022 10:20 AM  Performed by: Mechele Claude, CRNAPre-anesthesia Checklist: Patient identified, Emergency Drugs available, Suction available and Patient being monitored Patient Re-evaluated:Patient Re-evaluated prior to induction Oxygen Delivery Method: Circle system utilized Preoxygenation: Pre-oxygenation with 100% oxygen Induction Type: IV induction Ventilation: Mask ventilation without difficulty LMA: LMA inserted LMA Size: 4.0 Number of attempts: 1 Airway Equipment and Method: Bite block Placement Confirmation: positive ETCO2 Tube secured with: Tape Dental Injury: Teeth and Oropharynx as per pre-operative assessment

## 2022-05-26 NOTE — Transfer of Care (Signed)
Immediate Anesthesia Transfer of Care Note  Patient: Ashley Burns  Procedure(s) Performed: Procedure(s) (LRB): DILATATION & CURETTAGE/HYSTEROSCOPY WITH MYOSURE RESECTION OF ENDOMETRIAL POLYP (N/A)  Patient Location: PACU  Anesthesia Type: General  Level of Consciousness: awake, alert  and oriented  Airway & Oxygen Therapy: Patient Spontanous Breathing om room air  Post-op Assessment: Report given to PACU RN and Post -op Vital signs reviewed and stable  Post vital signs: Reviewed and stable  Complications: No apparent anesthesia complications  Last Vitals:  Vitals Value Taken Time  BP    Temp    Pulse 58 05/26/22 1058  Resp 16 05/26/22 1058  SpO2 98 % 05/26/22 1058  Vitals shown include unvalidated device data.  Last Pain:  Vitals:   05/26/22 0846  TempSrc: Oral  PainSc: 0-No pain      Patients Stated Pain Goal: 4 (48/27/07 8675)  Complications: No notable events documented.

## 2022-05-26 NOTE — Interval H&P Note (Signed)
History and Physical Interval Note:  05/26/2022 10:09 AM  Ashley Burns  has presented today for surgery, with the diagnosis of post menopausal bleeding  endometrial thickening on ultrasound Intramural and suubserosal leiomyoma.  The various methods of treatment have been discussed with the patient and family. After consideration of risks, benefits and other options for treatment, the patient has consented to  DIAGNOSTIC HYSTEROSCOPY, HYSTEROSCOPIC RESECTION OF ENDOMETRIAL POLYP, DILATION AND CURETTAGE as a surgical intervention.  The patient's history has been reviewed, patient examined, no change in status, stable for surgery.  I have reviewed the patient's chart and labs.  Questions were answered to the patient's satisfaction.     Myya Meenach A Ambrosio Reuter

## 2022-05-26 NOTE — H&P (Signed)
Ashley Burns is an 61 y.o. female. [id number] MBF presents for surgical management of PMB, endometrial mass/polyp  Pertinent Gynecological History: Menses: post-menopausal Bleeding: post menopausal bleeding Contraception: none DES exposure: denies Blood transfusions: none Sexually transmitted diseases: no past history Previous GYN Procedures:  c/s   Last mammogram: normal Date: 06/2021 Last pap: normal Date: 06/2021 OB History: G3, P2   Menstrual History: Menarche age: n/a Patient's last menstrual period was 11/06/2012.    Past Medical History:  Diagnosis Date   Arthritis    CAD in native artery 03/18/2022   Hypertension    with pregnancy only   Morbid obesity ([place]) 10/26/2021   PMB (postmenopausal bleeding)    Prediabetes 10/26/2021   Pure hypercholesterolemia 10/26/2021   RBBB 10/26/2021   on ekg   Shortness of breath 10/26/2021   none in 2 months per pt on 05-19-2022   Wears glasses     Past Surgical History:  Procedure Laterality Date   CESAREAN SECTION     x3   COLONOSCOPY  04/2021   EXCISION MASS LOWER EXTREMETIES Right 05/26/2021   Procedure: EXCISION RIGHT THIGH MASS;  Surgeon: Coralie Keens, MD;  Location: [facility];  Service: General;  Laterality: Right;    Family History  Problem Relation Age of Onset   Cancer Mother        breast   Heart attack Father    Diabetes Father    Hypertension Father    Heart disease Father    Heart attack Brother    Cancer Cousin        breast   Colon cancer Neg Hx    Esophageal cancer Neg Hx    Stomach cancer Neg Hx    Rectal cancer Neg Hx     Social History:  reports that she has never smoked. She has never used smokeless tobacco. She reports that she does not currently use alcohol. She reports that she does not use drugs.  Allergies:  Allergies  Allergen Reactions   Shellfish Allergy     itching    No medications prior to admission.    Review of Systems  All other systems reviewed and are  negative.   Height '5\' 6"'$  (1.676 m), weight 104.3 kg, last menstrual period 11/06/2012. Physical Exam Constitutional:      Appearance: Normal appearance.  HENT:     Head: Atraumatic.  Eyes:     Extraocular Movements: Extraocular movements intact.  Cardiovascular:     Rate and Rhythm: Regular rhythm.     Heart sounds: Normal heart sounds.  Pulmonary:     Effort: Pulmonary effort is normal.  Abdominal:     Palpations: Abdomen is soft.  Genitourinary:    General: Normal vulva.     Comments: Vagina atrophic pale mucosa Cervix parous Uterus AV irreg enlarged Adnexa nl Musculoskeletal:        General: Normal range of motion.     Cervical back: Neck supple.  Skin:    General: Skin is warm and dry.  Neurological:     General: No focal deficit present.     Mental Status: She is alert and oriented to person, place, and time.  Psychiatric:        Mood and Affect: Mood normal.        Behavior: Behavior normal.     No results found for this or any previous visit (from the past 24 hour(s)).  No results found.  Assessment/Plan: PMB Endometrial polyp P) dx hysteroscopy, D&C , resection  of endometrial polyp using myosure. Procedure explained. Risk of surgery reviewed including infection, bleeding, injury to surrounding organ structures, thermal injury, uterine perforation ( 09/998) and its risk, fluid overload and its mgmt., possible need for blood transfusion and its risk( HIV, hepatitis, fever/rash)All ? answered  Jeremi Losito A Dakoda [place] 05/26/2022, 3:14 AM

## 2022-05-26 NOTE — Anesthesia Postprocedure Evaluation (Signed)
Anesthesia Post Note  Patient: Ashley Burns  Procedure(s) Performed: DILATATION & CURETTAGE/HYSTEROSCOPY WITH MYOSURE RESECTION OF ENDOMETRIAL POLYP (Vagina )     Patient location during evaluation: PACU Anesthesia Type: General Level of consciousness: awake and alert Pain management: pain level controlled Vital Signs Assessment: post-procedure vital signs reviewed and stable Respiratory status: spontaneous breathing, nonlabored ventilation, respiratory function stable and patient connected to nasal cannula oxygen Cardiovascular status: blood pressure returned to baseline and stable Postop Assessment: no apparent nausea or vomiting Anesthetic complications: no   No notable events documented.  Last Vitals:  Vitals:   05/26/22 1130 05/26/22 1150  BP: 109/70 97/74  Pulse: (!) 49 66  Resp: 11 14  Temp:  36.4 C  SpO2: 96% 99%    Last Pain:  Vitals:   05/26/22 1150  TempSrc:   PainSc: 0-No pain                 Barnet Glasgow

## 2022-05-30 ENCOUNTER — Encounter (HOSPITAL_BASED_OUTPATIENT_CLINIC_OR_DEPARTMENT_OTHER): Payer: Self-pay | Admitting: Obstetrics and Gynecology

## 2022-05-30 LAB — SURGICAL PATHOLOGY

## 2022-06-01 ENCOUNTER — Other Ambulatory Visit (HOSPITAL_BASED_OUTPATIENT_CLINIC_OR_DEPARTMENT_OTHER): Payer: Self-pay

## 2022-06-05 ENCOUNTER — Other Ambulatory Visit (HOSPITAL_BASED_OUTPATIENT_CLINIC_OR_DEPARTMENT_OTHER): Payer: Self-pay

## 2022-06-06 ENCOUNTER — Encounter: Payer: Self-pay | Admitting: Pharmacist Clinician (PhC)/ Clinical Pharmacy Specialist

## 2022-06-06 ENCOUNTER — Ambulatory Visit
Payer: BC Managed Care – PPO | Attending: Cardiology | Admitting: Pharmacist Clinician (PhC)/ Clinical Pharmacy Specialist

## 2022-06-06 DIAGNOSIS — E669 Obesity, unspecified: Secondary | ICD-10-CM

## 2022-06-06 NOTE — Progress Notes (Signed)
HPI: Ashley Burns is a 61 y.o. female patient referred to pharmacy clinic by Dr. Oval Linsey to initiate weight loss therapy with GLP1-RA.  Most recent BMI 38.8 today.  Patient notes that her work keeps her busy and she notes a 10-20 pound weight gain in the past year.  She was started on Wegovy but stopped after just a couple of doses because of a sore that developed near her knee.  She was advised to hold until area was cleared up then could re-challenge.  Once the area was mostly cleared up she took the final 2 doses of 0.25 mg, but has not had any since.  She notes the area is still off color and has appointment with dermatology soon.  After taking a couple of weeks off, she re-started the medication.    She has now successfully taken 4 weeks of the 0.25 mg dose and 3 weeks of the 0.5 mg.  Her last dose of that is due today and she has been able to get the 1 mg injections, which she will pick up today.  She states that her appetite has decreased significantly, and when eating out, is only eating 1/2 of the meal, and even has switched her vitamins from gummies, because they just don't taste good anymore.     Weight today: 105.4 kg  (BMI 37.53) Last OV weight 110.224 kg ( 03/02/22) Difference:  4.824 kg % wt loss:  4.6%  Significant medical history: hyperlipidemia 11/22 LDL 144 - no medication  Pre-diabetes 11/22 A1c 5.7 - no medication, working on weight/lifestyle   Current weight management medications: none  Previously tried meds: none  Current meds that may affect weight: none  Baseline weight/BMI: 110.224 kg,  BMI 39.24  Insurance payor: [employer] - no cost to patient   Diet: since last visit, has been reading labels, stopped fried foods (especially [place] fries), sides now are vegetables, fruits; less bread  Exercise: started back to walking walking 30 minutes twice weekly; had to stop for surgery  Family History: brother had MI, died at 45; father with heart disease, mother  lived to 42; 2 children healthy  Confirmed patient not pregnant and no personal or family history of medullary thyroid carcinoma (MTC) or Multiple Endocrine Neoplasia syndrome type 2 (MEN 2).    Labs: Lab Results  Component Value Date   HGBA1C 5.6 02/22/2022    Wt Readings from Last 1 Encounters:  06/06/22 232 lb 6.4 oz (105.4 kg)    BP Readings from Last 1 Encounters:  06/06/22 113/80   Pulse Readings from Last 1 Encounters:  05/26/22 66       Component Value Date/Time   CHOL 216 (H) 02/22/2022 1009   TRIG 106 02/22/2022 1009   HDL 50 02/22/2022 1009   CHOLHDL 4.3 02/22/2022 1009   [place] 147 (H) 02/22/2022 1009    Past Medical History:  Diagnosis Date   Arthritis    CAD in native artery 03/18/2022   Hypertension    with pregnancy only   Morbid obesity ([place]) 10/26/2021   PMB (postmenopausal bleeding)    Prediabetes 10/26/2021   Pure hypercholesterolemia 10/26/2021   RBBB 10/26/2021   on ekg   Shortness of breath 10/26/2021   none in 2 months per pt on 05-19-2022   Wears glasses     Current Outpatient Medications on File Prior to Visit  Medication Sig Dispense Refill   BIOTIN PO Take 2,500 mcg by mouth daily. Hair/skin/nails     diclofenac  Sodium (VOLTAREN) 1 % GEL Apply 2 g topically 4 (four) times daily. (Patient taking differently: Apply 2 g topically daily as needed (Knee pain).) 100 g 2   ELDERBERRY PO Take by mouth daily. Ran out of     Ferrous Sulfate (IRON PO) Take 325 mg by mouth daily.     Multiple Vitamin (MULTIVITAMIN ADULT PO) Take 1 tablet by mouth daily at 12 noon.     Omega-3 Fatty Acids (FISH OIL PO) Take by mouth. 3 x weekly     psyllium (METAMUCIL) 58.6 % powder Take 1 packet by mouth daily.     rosuvastatin (CRESTOR) 20 MG tablet Please take MWF orally (Patient taking differently: Take 20 mg by mouth daily. Please take M-onday-Wednesday-friday) 36 tablet 2   Semaglutide-Weight Management 0.5 MG/0.5ML SOAJ Inject 0.5 mg into the skin once a  week. (Patient taking differently: Inject 0.5 mg into the skin once a week. Tuesdays current dose is 0.'5mg'$  per pt on 05-19-2022) 2 mL 1   Semaglutide-Weight Management 1 MG/0.5ML SOAJ Inject 1 mg into the skin once a week. 2 mL 1   Semaglutide-Weight Management 1.7 MG/0.75ML SOAJ Inject 1.7 mg into the skin once a week. 3 mL 1   [START ON 06/26/2022] Semaglutide-Weight Management 2.4 MG/0.75ML SOAJ Inject 2.4 mg into the skin once a week. 3 mL 1   TURMERIC PO Take 1 tablet by mouth daily.     zinc gluconate 50 MG tablet Take 50 mg by mouth once a week.     No current facility-administered medications on file prior to visit.    Allergies  Allergen Reactions   Shellfish Allergy     itching    Obesity Patient with obesity, now 7 weeks in [facility] treatment.  She is tolerating the medication well and will increase to 1 mg dose in another week.     Reviewed common side effects including nausea, diarrhea, dyspepsia, decreased appetite, and fatigue. Counseled patient on reducing meal size and how to titrate medication to minimize side effects. Patient aware to call if intolerable side effects or if experiencing dehydration, abdominal pain, or dizziness. Patient will adhere to dietary modifications and will target at least 150 minutes of moderate intensity exercise weekly.   Titration Plan:  Will plan to follow the titration plan as below, pending patient is tolerating each dose before increasing to the next. Can slow titration if needed for tolerability.   1 mg x 4 wks, if tolerate, increase to 1.7 mg x 4 weeks, then 2.4 mg weekly thereafter    Tommy Medal PharmD CPP [facility] [place]

## 2022-06-06 NOTE — Patient Instructions (Signed)
   TIPS FOR SUCCESS Write down the reasons why you want to lose weight and post it in a place where you'll see it often. Start small and work your way up. Keep in mind that it takes time to achieve goals, and small steps add up. Any additional movements help to burn calories. Taking the stairs rather than the elevator and parking at the far end of your parking lot are easy ways to start. Brisk walking for at least 30 minutes 4 or more days of the week is an excellent goal to work toward  [place] Did you know that it can take 15 minutes or more for your brain to receive the message that you've eaten? That means that, if you eat less food, but consume it slower, you may still feel satisfied. Eating a lot of fruits and vegetables can also help you feel fuller. Eat off of smaller plates so that moderate portions don't seem too small  TITRATION PLAN Will plan to follow the titration plan as below, pending patient is tolerating each dose before increasing to the next. Can slow titration if needed for tolerability.    Take 2 more weeks of the 0.5 mg dose then: -Weeks 9-12 Inject 1 mg SQ once weekly  -Weeks 13-16: Inject 1.7 mg  SQ once weekly   Follow up in 3 months.  If you have any questions or concerns, please reach out to [place].  Alithia Zavaleta/Chris at [phone].  [place]

## 2022-06-06 NOTE — Assessment & Plan Note (Addendum)
Patient with obesity, now 7 weeks in [facility] treatment.  She is tolerating the medication well and will increase to 1 mg dose in another week.     Reviewed common side effects including nausea, diarrhea, dyspepsia, decreased appetite, and fatigue. Counseled patient on reducing meal size and how to titrate medication to minimize side effects. Patient aware to call if intolerable side effects or if experiencing dehydration, abdominal pain, or dizziness. Patient will adhere to dietary modifications and will target at least 150 minutes of moderate intensity exercise weekly.   Titration Plan:  Will plan to follow the titration plan as below, pending patient is tolerating each dose before increasing to the next. Can slow titration if needed for tolerability.   1 mg x 4 wks, if tolerate, increase to 1.7 mg x 4 weeks, then 2.4 mg weekly thereafter

## 2022-07-17 ENCOUNTER — Other Ambulatory Visit (HOSPITAL_BASED_OUTPATIENT_CLINIC_OR_DEPARTMENT_OTHER): Payer: Self-pay

## 2022-07-19 LAB — HM MAMMOGRAPHY: HM Mammogram: NORMAL (ref 0–4)

## 2022-07-20 LAB — HM PAP SMEAR: HM Pap smear: NORMAL

## 2022-08-11 ENCOUNTER — Other Ambulatory Visit (HOSPITAL_BASED_OUTPATIENT_CLINIC_OR_DEPARTMENT_OTHER): Payer: Self-pay

## 2022-08-11 ENCOUNTER — Other Ambulatory Visit: Payer: Self-pay

## 2022-08-15 ENCOUNTER — Encounter (HOSPITAL_BASED_OUTPATIENT_CLINIC_OR_DEPARTMENT_OTHER): Payer: Self-pay | Admitting: Cardiovascular Disease

## 2022-08-15 ENCOUNTER — Ambulatory Visit (HOSPITAL_BASED_OUTPATIENT_CLINIC_OR_DEPARTMENT_OTHER): Payer: BC Managed Care – PPO | Admitting: Cardiovascular Disease

## 2022-08-15 VITALS — BP 110/72 | HR 60 | Ht 66.0 in | Wt 220.3 lb

## 2022-08-15 DIAGNOSIS — I2510 Atherosclerotic heart disease of native coronary artery without angina pectoris: Secondary | ICD-10-CM

## 2022-08-15 DIAGNOSIS — Z5181 Encounter for therapeutic drug level monitoring: Secondary | ICD-10-CM

## 2022-08-15 DIAGNOSIS — E7800 Pure hypercholesterolemia, unspecified: Secondary | ICD-10-CM

## 2022-08-15 NOTE — Assessment & Plan Note (Signed)
She has responded very well to semaglutide.  Keep working on diet and exercise.  She has had some oral dryness that I suspect is due to intravascular depletion.  Work on increasing fluid intake.

## 2022-08-15 NOTE — Patient Instructions (Signed)
Medication Instructions:  Your physician recommends that you continue on your current medications as directed. Please refer to the Current Medication list given to you today.   *If you need a refill on your cardiac medications before your next appointment, please call your pharmacy*  Lab Work: FASTING LP/CMET SOON   If you have labs (blood work) drawn today and your tests are completely normal, you will receive your results only by: [place] (if you have MyChart) OR A paper copy in the mail If you have any lab test that is abnormal or we need to change your treatment, we will call you to review the results.  Testing/Procedures: NONE  Follow-Up: At [facility], you and your health needs are our priority.  As part of our continuing mission to provide you with exceptional heart care, we have created designated Provider Care Teams.  These Care Teams include your primary Cardiologist (physician) and Advanced Practice Providers (APPs -  Physician Assistants and Nurse Practitioners) who all work together to provide you with the care you need, when you need it.  We recommend signing up for the patient portal called "MyChart".  Sign up information is provided on this After Visit Summary.  MyChart is used to connect with patients for Virtual Visits (Telemedicine).  Patients are able to view lab/test results, encounter notes, upcoming appointments, etc.  Non-urgent messages can be sent to your provider as well.   To learn more about what you can do with MyChart, go to [email or web address].    Your next appointment:   6 month(s)  The format for your next appointment:   In Person  Provider:   Skeet Latch, MD

## 2022-08-15 NOTE — Assessment & Plan Note (Signed)
Nonobstructive CAD.  She has no ischemic symptoms.  LDL goal is less than 70.  She will get fasting lipids and a CMP checked.  Continue rosuvastatin.  Continue working on diet and exercise.  She is doing a great job.

## 2022-08-15 NOTE — Progress Notes (Signed)
Cardiology Office Note:    Date:  08/15/2022   ID:  Ashley Burns, DOB [birth year], MRN [id number]  PCP:  Glendale Chard, MD  Cardiologist:  None    Referring MD: Glendale Chard, MD   No chief complaint on file.   History of Present Illness:    Ashley Burns is a 61 y.o. female with a hx of non-obstructive CAD, hypertension with pregnancy and RBBB here for follow-up.  She was seen 10/2021 for the evaluation of shortness of breath, palpitations, and family history of heart disease at the request of Dr. Baird Cancer.  She last saw Dr. Baird Cancer 07/2021 for annual physical exam. She complained of shortness of breath at rest and palpitations lasting a few months prior. She also endorsed a family history of heart disease on her father's side. She was referred to cardiology.  Her brother used crutches due to neonatal leg injuries and had various MIs and passed away in his 18s. Her father also had cardiovascular disease.   We discussed focusing on diet and exercise.  We also started her on Ozempic.  She got a very calcium score 11/2021 that revealed a score of 5, which was 75th percentile for age and gender.  She was started on rosuvastatin.   Today, she is accompanied by her daughter. She reports she has lost weight with semaglutide. She has been staying active by walking and dancing (zumba). Since losing weight she has been feeling a lot better, with more energy, and swelling resolved. Her daughter notes that she has not been consuming alcohol and her appetite has decreased. She stopped using topical cream on her LEs but is still using tumeric about 2-3 times a week. She endorses dryness in her mouth and lips and mild swelling in her lips. She denies any palpitations, chest pain, shortness of breath, or peripheral edema. No lightheadedness, headaches, syncope, orthopnea, or PND.  Past Medical History:  Diagnosis Date   Arthritis    CAD in native artery 03/18/2022   Hypertension    with pregnancy  only   Morbid obesity ([place]) 10/26/2021   PMB (postmenopausal bleeding)    Prediabetes 10/26/2021   Pure hypercholesterolemia 10/26/2021   RBBB 10/26/2021   on ekg   Shortness of breath 10/26/2021   none in 2 months per pt on 05-19-2022   Wears glasses     Past Surgical History:  Procedure Laterality Date   CESAREAN SECTION     x3   COLONOSCOPY  04/2021   DILATATION & CURETTAGE/HYSTEROSCOPY WITH MYOSURE N/A 05/26/2022   Procedure: DILATATION & CURETTAGE/HYSTEROSCOPY WITH MYOSURE RESECTION OF ENDOMETRIAL POLYP;  Surgeon: Servando Salina, MD;  Location: [place];  Service: Gynecology;  Laterality: N/A;   EXCISION MASS LOWER EXTREMETIES Right 05/26/2021   Procedure: EXCISION RIGHT THIGH MASS;  Surgeon: Coralie Keens, MD;  Location: [place];  Service: General;  Laterality: Right;    Current Medications: Current Meds  Medication Sig   BIOTIN PO Take 2,500 mcg by mouth daily. Hair/skin/nails   diclofenac Sodium (VOLTAREN) 1 % GEL Apply 2 g topically 4 (four) times daily. (Patient taking differently: Apply 2 g topically daily as needed (Knee pain).)   ELDERBERRY PO Take by mouth daily. Ran out of   Ferrous Sulfate (IRON PO) Take 325 mg by mouth daily.   Multiple Vitamin (MULTIVITAMIN ADULT PO) Take 1 tablet by mouth daily at 12 noon.   Omega-3 Fatty Acids (FISH OIL PO) Take by mouth. 3 x weekly   rosuvastatin (CRESTOR)  20 MG tablet Please take MWF orally (Patient taking differently: Take 20 mg by mouth daily. Please take M-onday-Wednesday-friday)   Semaglutide-Weight Management 1.7 MG/0.75ML SOAJ Inject 1.7 mg into the skin once a week.   Semaglutide-Weight Management 2.4 MG/0.75ML SOAJ Inject 2.4 mg into the skin once a week.   TURMERIC PO Take 1 tablet by mouth daily.   zinc gluconate 50 MG tablet Take 50 mg by mouth once a week.     Allergies:   Shellfish allergy   Social History   Socioeconomic History   Marital status: Married    Spouse name: Not on file    Number of children: Not on file   Years of education: Not on file   Highest education level: Not on file  Occupational History   Not on file  Tobacco Use   Smoking status: Never   Smokeless tobacco: Never  Vaping Use   Vaping Use: Never used  Substance and Sexual Activity   Alcohol use: Not Currently   Drug use: No   Sexual activity: Not on file  Other Topics Concern   Not on file  Social History Narrative   Not on file   Social Determinants of Health   Financial Resource Strain: Low Risk  (10/26/2021)   Overall Financial Resource Strain (CARDIA)    Difficulty of Paying Living Expenses: Not hard at all  Food Insecurity: No Food Insecurity (10/26/2021)   Hunger Vital Sign    Worried About Running Out of Food in the Last Year: Never true    [place] in the Last Year: Never true  Transportation Needs: No Transportation Needs (10/26/2021)   PRAPARE - Hydrologist (Medical): No    Lack of Transportation (Non-Medical): No  Physical Activity: Inactive (10/26/2021)   Exercise Vital Sign    Days of Exercise per Week: 0 days    Minutes of Exercise per Session: 0 min  Stress: Not on file  Social Connections: Not on file     Family History: The patient's family history includes Cancer in her cousin and mother; Diabetes in her father; Heart attack in her brother and father; Heart disease in her father; Hypertension in her father. There is no history of Colon cancer, Esophageal cancer, Stomach cancer, or Rectal cancer.  ROS:   Please see the history of present illness.  (+) Dry mouth/lips (+) mild swelling of lips All other systems reviewed and negative.   EKGs/Labs/Other Studies Reviewed:    The following studies were reviewed today: Lower Extremity Duplex 09/05/12 - No evidence of deep vein thrombosis involving the    visualized veins of the left lower extremity.  - No evidence of Baker's cyst on the left.   Coronary Calcium score 11/2021:   IMPRESSION: 1. Coronary calcium score of 5. This was 75th percentile for age, gender, and race matched controls.  EKG:  EKG is personally reviewed. 08/15/2022: Sinus rhythm. Rate 60 bpm. LFAC. RBBB. 10/26/21: Sinus bradycardia, rate 55 bpm; LAFB and RBBB  Recent Labs: 08/22/2021: Magnesium 1.8 02/22/2022: ALT 19; BUN 16; Creatinine, Ser 0.94; Potassium 4.5; Sodium 143; TSH 3.480 05/26/2022: Hemoglobin 13.2; Platelets 225   Recent Lipid Panel    Component Value Date/Time   CHOL 216 (H) 02/22/2022 1009   TRIG 106 02/22/2022 1009   HDL 50 02/22/2022 1009   CHOLHDL 4.3 02/22/2022 1009   LDLCALC 147 (H) 02/22/2022 1009    Physical Exam:    VS:  BP 110/72 (BP  Location: Left Arm, Patient Position: Sitting, Cuff Size: Large)   Pulse 60   Ht '5\' 6"'$  (1.676 m)   Wt 220 lb 4.8 oz (99.9 kg)   LMP 11/06/2012   BMI 35.56 kg/m  , BMI Body mass index is 35.56 kg/m. GENERAL:  Well appearing HEENT: Pupils equal round and reactive, fundi not visualized, oral mucosa unremarkable NECK:  No jugular venous distention, waveform within normal limits, carotid upstroke brisk and symmetric, no bruits, no thyromegaly LUNGS:  Clear to auscultation bilaterally HEART:  Bradycardic.  PMI not displaced or sustained,S1 and S2 within normal limits, no S3, no S4, no clicks, no rubs, no murmurs ABD:  Flat, positive bowel sounds normal in frequency in pitch, no bruits, no rebound, no guarding, no midline pulsatile mass, no hepatomegaly, no splenomegaly EXT:  2 plus pulses throughout, trace edema, no cyanosis no clubbing SKIN:  No rashes no nodules NEURO:  Cranial nerves II through XII grossly intact, motor grossly intact throughout PSYCH:  Cognitively intact, oriented to person place and time  ASSESSMENT:    1. CAD in native artery   2. Morbid obesity ([place])   3. Pure hypercholesterolemia   4. Therapeutic drug monitoring      PLAN:    CAD in native artery Nonobstructive CAD.  She has no ischemic symptoms.   LDL goal is less than 70.  She will get fasting lipids and a CMP checked.  Continue rosuvastatin.  Continue working on diet and exercise.  She is doing a great job.  Morbid obesity ([place]) She has responded very well to semaglutide.  Keep working on diet and exercise.  She has had some oral dryness that I suspect is due to intravascular depletion.  Work on increasing fluid intake.  Disposition: FU with Amilah Greenspan C. Oval Linsey, MD, [facility] in 6 months  Medication Adjustments/Labs and Tests Ordered: Current medicines are reviewed at length with the patient today.  Concerns regarding medicines are outlined above.  Orders Placed This Encounter  Procedures   Comprehensive metabolic panel   Lipid panel   EKG 12-Lead   No orders of the defined types were placed in this encounter.  I,Rachel Rivera,acting as a Education administrator for Skeet Latch, MD.,have documented all relevant documentation on the behalf of Skeet Latch, MD,as directed by  Skeet Latch, MD while in the presence of Skeet Latch, MD.  I, [place] Oval Linsey, MD have reviewed all documentation for this visit.  The documentation of the exam, diagnosis, procedures, and orders on 08/15/2022 are all accurate and complete.   Signed, Skeet Latch, MD  08/15/2022 5:24 PM    [place]

## 2022-08-17 LAB — COMPREHENSIVE METABOLIC PANEL
ALT: 11 IU/L (ref 0–32)
AST: 18 IU/L (ref 0–40)
Albumin/Globulin Ratio: 1.6 (ref 1.2–2.2)
Albumin: 4.2 g/dL (ref 3.9–4.9)
Alkaline Phosphatase: 78 IU/L (ref 44–121)
BUN/Creatinine Ratio: 13 (ref 12–28)
BUN: 9 mg/dL (ref 8–27)
Bilirubin Total: 0.5 mg/dL (ref 0.0–1.2)
CO2: 22 mmol/L (ref 20–29)
Calcium: 9.6 mg/dL (ref 8.7–10.3)
Chloride: 101 mmol/L (ref 96–106)
Creatinine, Ser: 0.72 mg/dL (ref 0.57–1.00)
Globulin, Total: 2.7 g/dL (ref 1.5–4.5)
Glucose: 85 mg/dL (ref 70–99)
Potassium: 4.1 mmol/L (ref 3.5–5.2)
Sodium: 144 mmol/L (ref 134–144)
Total Protein: 6.9 g/dL (ref 6.0–8.5)
eGFR: 95 mL/min/{1.73_m2} (ref >59)

## 2022-08-17 LAB — LIPID PANEL
Chol/HDL Ratio: 2.9 ratio (ref 0.0–4.4)
Cholesterol, Total: 141 mg/dL (ref 100–199)
HDL: 49 mg/dL (ref >39)
LDL Chol Calc (NIH): 80 mg/dL (ref 0–99)
Triglycerides: 54 mg/dL (ref 0–149)
VLDL Cholesterol Cal: 12 mg/dL (ref 5–40)

## 2022-08-29 ENCOUNTER — Encounter (HOSPITAL_BASED_OUTPATIENT_CLINIC_OR_DEPARTMENT_OTHER): Payer: Self-pay | Admitting: *Deleted

## 2022-08-29 ENCOUNTER — Telehealth: Payer: Self-pay | Admitting: Cardiovascular Disease

## 2022-08-29 NOTE — Telephone Encounter (Signed)
Returned call to patient,   Patient was concerned that the results for both panels were on both of the results tab. Patient states in the future she would like the results separated for better understanding. Patient would like to further research the medication and she will follow up with [place] if she wants the medication sent in.       "Normal kidneys, liver, electrolytes. Cholesterol much improved with LDL (bad cholesterol) decreased from 147 to 80. Not yet at goal of <70. Recommend addition of Zetia '10mg'$  daily with FLP/LFT in 3 mos."

## 2022-08-29 NOTE — Telephone Encounter (Signed)
Patient is returning call to discuss lab results. 

## 2022-09-05 ENCOUNTER — Ambulatory Visit
Payer: BC Managed Care – PPO | Attending: Internal Medicine | Admitting: Pharmacist Clinician (PhC)/ Clinical Pharmacy Specialist

## 2022-09-05 ENCOUNTER — Encounter: Payer: Self-pay | Admitting: Pharmacist Clinician (PhC)/ Clinical Pharmacy Specialist

## 2022-09-05 VITALS — BP 111/70 | HR 57 | Ht 66.0 in | Wt 218.6 lb

## 2022-09-05 DIAGNOSIS — E669 Obesity, unspecified: Secondary | ICD-10-CM | POA: Diagnosis not present

## 2022-09-05 NOTE — Patient Instructions (Addendum)
   TIPS FOR SUCCESS Write down the reasons why you want to lose weight and post it in a place where you'll see it often. Start small and work your way up. Keep in mind that it takes time to achieve goals, and small steps add up. Any additional movements help to burn calories. Taking the stairs rather than the elevator and parking at the far end of your parking lot are easy ways to start. Brisk walking for at least 30 minutes 4 or more days of the week is an excellent goal to work toward  [place] Did you know that it can take 15 minutes or more for your brain to receive the message that you've eaten? That means that, if you eat less food, but consume it slower, you may still feel satisfied. Eating a lot of fruits and vegetables can also help you feel fuller. Eat off of smaller plates so that moderate portions don't seem too small  TITRATION PLAN Will plan to follow the titration plan as below, pending patient is tolerating each dose before increasing to the next. Can slow titration if needed for tolerability.    Increase to 2.4 mg weekly next week.  If you do well with the first dose or two, let me know in MyChart and I will send in more   As of today you have lost 11.1 kg (24.4 pounds) - approximately 10% of your starting body weight.  If you have any questions or concerns, please reach out to [place].  Akyla Vavrek/Chris at [phone].  [place]

## 2022-09-05 NOTE — Progress Notes (Signed)
HPI: Ashley Burns is a 61 y.o. female patient referred to pharmacy clinic by Dr. Oval Linsey to initiate weight loss therapy with GLP1-RA.  Most recent BMI 38.8 today.  Patient notes that her work keeps her busy and she notes a 10-20 pound weight gain in the past year.  She was started on Wegovy but stopped after just a couple of doses because of a sore that developed near her knee.  She was advised to hold until area was cleared up then could re-challenge.  Once the area was mostly cleared up she took the final 2 doses of 0.25 mg, but has not had any since.  She notes the area is still off color and has appointment with dermatology soon.  After taking a couple of weeks off, she re-started the medication.  At her last visit was on the 0.5 mg dose and doing well.    She has now successfully taken 4 weeks of the 0.25 mg dose and 3 weeks of the 0.5 mg.  Her last dose of that is due today and she has been able to get the 1 mg injections, which she will pick up today.  She states that her appetite has decreased significantly, and when eating out, is only eating 1/2 of the meal, and even has switched her vitamins from gummies, because they just don't taste good anymore.     Today she returns for follow up. Since I saw her last she has titrated from 0.5 mg to 1 and now 1.7 mg doses.  She saw Dr. Oval Linsey last month and was down to 99.9 kg (10.3 kg wt loss).  Today she is down another 0.8 kg.  States that she has not had to use any Voltaren Gel on her knees in the last few weeks, but she has seen an increase in constipation.  Is mindful to eat plenty of vegetables and most protein is from chicken and salmon.    03/02/22 Weight today 110.2 kg (BMI 39.22)  06/06/22 Weight today: 105.4 kg  (BMI 37.53) Last OV weight 110.2 kg ( 03/02/22) Difference:  4.824 kg % wt loss:  4.6%  09/05/22 Weight today:  Last OV weight 105.4 kg (BMI 37.53) Difference (from first weight):  11.1 kg % wt loss (from first weight):  10%    Significant medical history: hyperlipidemia 11/22 LDL 144 - no medication  Pre-diabetes 11/22 A1c 5.7 - no medication, working on weight/lifestyle   Current weight management medications: none  Previously tried meds: none  Current meds that may affect weight: none  Baseline weight/BMI: 110.224 kg,  BMI 39.24  Insurance payor: Art therapist - no cost to patient   Diet: since last visit, has been reading labels, stopped fried foods (especially [place] fries) - finds it hard to swallow the high fat foods;  sides now are vegetables, fruits; doing a spinach/fruit smoothie once or twice a week  Exercise: admits to having a hard time keeping an exercise regimen.  Some walking, but would like to do better  Family History: brother had MI, died at 64; father with heart disease, mother lived to 62; 2 children healthy  Confirmed patient not pregnant and no personal or family history of medullary thyroid carcinoma (MTC) or Multiple Endocrine Neoplasia syndrome type 2 (MEN 2).    Labs: Lab Results  Component Value Date   HGBA1C 5.6 02/22/2022    Wt Readings from Last 1 Encounters:  09/05/22 218 lb 9.6 oz (99.2 kg)    BP Readings  from Last 1 Encounters:  09/05/22 111/70   Pulse Readings from Last 1 Encounters:  09/05/22 (!) 57       Component Value Date/Time   CHOL 141 08/16/2022 1039   TRIG 54 08/16/2022 1039   HDL 49 08/16/2022 1039   CHOLHDL 2.9 08/16/2022 1039   [place] 80 08/16/2022 1039    Past Medical History:  Diagnosis Date   Arthritis    CAD in native artery 03/18/2022   Hypertension    with pregnancy only   Morbid obesity ([place]) 10/26/2021   PMB (postmenopausal bleeding)    Prediabetes 10/26/2021   Pure hypercholesterolemia 10/26/2021   RBBB 10/26/2021   on ekg   Shortness of breath 10/26/2021   none in 2 months per pt on 05-19-2022   Wears glasses     Current Outpatient Medications on File Prior to Visit  Medication Sig Dispense Refill   BIOTIN PO  Take 2,500 mcg by mouth daily. Hair/skin/nails     diclofenac Sodium (VOLTAREN) 1 % GEL Apply 2 g topically 4 (four) times daily. (Patient taking differently: Apply 2 g topically daily as needed (Knee pain).) 100 g 2   ELDERBERRY PO Take by mouth daily. Ran out of     Ferrous Sulfate (IRON PO) Take 325 mg by mouth daily.     Multiple Vitamin (MULTIVITAMIN ADULT PO) Take 1 tablet by mouth daily at 12 noon.     Omega-3 Fatty Acids (FISH OIL PO) Take by mouth. 3 x weekly     rosuvastatin (CRESTOR) 20 MG tablet Please take MWF orally (Patient taking differently: Take 20 mg by mouth daily. Please take M-onday-Wednesday-friday) 36 tablet 2   Semaglutide-Weight Management 1.7 MG/0.75ML SOAJ Inject 1.7 mg into the skin once a week. 3 mL 1   Semaglutide-Weight Management 2.4 MG/0.75ML SOAJ Inject 2.4 mg into the skin once a week. 3 mL 1   TURMERIC PO Take 1 tablet by mouth daily.     zinc gluconate 50 MG tablet Take 50 mg by mouth once a week.     No current facility-administered medications on file prior to visit.    Allergies  Allergen Reactions   Shellfish Allergy     itching    Obesity Patient now on week 3 of the Wegovy 1.7 mg dose.  Will take last dose tonight and increase to 2.4 mg weekly next Tuesday.  As of today she has lost 11.1 kg and 10% of her starting body weight.  Constipation has been issue, as in addition to [facility], she also takes iron supplementation.  Suggested using Miralax daily to help.  Will plan to see her back in 6 months for follow-up and to discuss consideration of weaning dose.    Tommy Medal PharmD CPP [facility]

## 2022-09-05 NOTE — Assessment & Plan Note (Signed)
Patient now on week 3 of the Wegovy 1.7 mg dose.  Will take last dose tonight and increase to 2.4 mg weekly next Tuesday.  As of today she has lost 11.1 kg and 10% of her starting body weight.  Constipation has been issue, as in addition to [facility], she also takes iron supplementation.  Suggested using Miralax daily to help.  Will plan to see her back in 6 months for follow-up and to discuss consideration of weaning dose.

## 2022-09-07 ENCOUNTER — Other Ambulatory Visit (HOSPITAL_BASED_OUTPATIENT_CLINIC_OR_DEPARTMENT_OTHER): Payer: Self-pay

## 2022-09-11 ENCOUNTER — Encounter: Payer: Self-pay | Admitting: Pharmacist Clinician (PhC)/ Clinical Pharmacy Specialist

## 2022-09-11 ENCOUNTER — Encounter (HOSPITAL_BASED_OUTPATIENT_CLINIC_OR_DEPARTMENT_OTHER): Payer: Self-pay | Admitting: Cardiovascular Disease

## 2022-09-11 ENCOUNTER — Encounter: Payer: Self-pay | Admitting: Internal Medicine

## 2022-09-11 ENCOUNTER — Telehealth: Payer: Self-pay | Admitting: Cardiovascular Disease

## 2022-09-11 ENCOUNTER — Ambulatory Visit (INDEPENDENT_AMBULATORY_CARE_PROVIDER_SITE_OTHER): Payer: BC Managed Care – PPO | Admitting: Internal Medicine

## 2022-09-11 VITALS — BP 120/80 | HR 68 | Temp 98.3°F | Ht 65.0 in | Wt 218.4 lb

## 2022-09-11 DIAGNOSIS — E7800 Pure hypercholesterolemia, unspecified: Secondary | ICD-10-CM

## 2022-09-11 DIAGNOSIS — R7309 Other abnormal glucose: Secondary | ICD-10-CM

## 2022-09-11 DIAGNOSIS — Z23 Encounter for immunization: Secondary | ICD-10-CM | POA: Diagnosis not present

## 2022-09-11 DIAGNOSIS — Z0000 Encounter for general adult medical examination without abnormal findings: Secondary | ICD-10-CM

## 2022-09-11 DIAGNOSIS — Z6836 Body mass index (BMI) 36.0-36.9, adult: Secondary | ICD-10-CM

## 2022-09-11 NOTE — Telephone Encounter (Signed)
Yes

## 2022-09-11 NOTE — Patient Instructions (Addendum)

## 2022-09-11 NOTE — Telephone Encounter (Signed)
  Pt c/o medication issue:  1. Name of Medication: Semaglutide-Weight Management 2.4 MG/0.75ML SOAJ   2. How are you currently taking this medication (dosage and times per day)?   Inject 2.4 mg into the skin once a week.    3. Are you having a reaction (difficulty breathing--STAT)? No   4. What is your medication issue? Pt said, per [facility] pharmacy, they need new prescriptions or prior auth to fill it. Pt's dose is due tomorrow

## 2022-09-11 NOTE — Telephone Encounter (Signed)
Ok to PA the 2.4 dose?

## 2022-09-11 NOTE — Telephone Encounter (Signed)
Go for it.  Thank you.  (And if you don't have time, send it my way)

## 2022-09-11 NOTE — Progress Notes (Signed)
Rich Brave [place]ittleton,acting as a Education administrator for Maximino Greenland, MD.,have documented all relevant documentation on the behalf of Maximino Greenland, MD,as directed by  Maximino Greenland, MD while in the presence of Maximino Greenland, MD.   Subjective:     Patient ID: Ashley Burns , female    DOB: 1961-06-19 , 61 y.o.   MRN: [id number]   Chief Complaint  Patient presents with   Annual Exam    HPI  Patient is here for physical exam. Patient receives her GYN from Dr Garwin Brothers. Her last PAP was 07/20/22. She has no specific concerns or complaints at this time. She is scheduled for u/s later today as per GYN Onc.      Past Medical History:  Diagnosis Date   Arthritis    CAD in native artery 03/18/2022   Hypertension    with pregnancy only   Morbid obesity ([place]) 10/26/2021   PMB (postmenopausal bleeding)    Prediabetes 10/26/2021   Pure hypercholesterolemia 10/26/2021   RBBB 10/26/2021   on ekg   Shortness of breath 10/26/2021   none in 2 months per pt on 05-19-2022   Wears glasses      Family History  Problem Relation Age of Onset   Cancer Mother        breast   Heart attack Father    Diabetes Father    Hypertension Father    Heart disease Father    Heart attack Brother    Cancer Cousin        breast   Colon cancer Neg Hx    Esophageal cancer Neg Hx    Stomach cancer Neg Hx    Rectal cancer Neg Hx      Current Outpatient Medications:    BIOTIN PO, Take 2,500 mcg by mouth daily. Hair/skin/nails, Disp: , Rfl:    ELDERBERRY PO, Take by mouth daily. Ran out of, Disp: , Rfl:    Ferrous Sulfate (IRON PO), Take 325 mg by mouth daily., Disp: , Rfl:    Multiple Vitamin (MULTIVITAMIN ADULT PO), Take 1 tablet by mouth daily at 12 noon., Disp: , Rfl:    Omega-3 Fatty Acids (FISH OIL PO), Take by mouth. 3 x weekly, Disp: , Rfl:    rosuvastatin (CRESTOR) 20 MG tablet, Please take MWF orally (Patient taking differently: Take 20 mg by mouth daily. Please take M-onday-Wednesday-friday),  Disp: 36 tablet, Rfl: 2   Semaglutide-Weight Management 2.4 MG/0.75ML SOAJ, Inject 2.4 mg into the skin once a week., Disp: 3 mL, Rfl: 1   TURMERIC PO, Take 1 tablet by mouth daily., Disp: , Rfl:    zinc gluconate 50 MG tablet, Take 50 mg by mouth once a week., Disp: , Rfl:    diclofenac Sodium (VOLTAREN) 1 % GEL, Apply 2 g topically 4 (four) times daily. (Patient not taking: Reported on 09/11/2022), Disp: 100 g, Rfl: 2   Allergies  Allergen Reactions   Shellfish Allergy     itching      The patient states she uses post menopausal status for birth control. Last LMP was Patient's last menstrual period was 11/06/2012.. Negative for Dysmenorrhea. Negative for: breast discharge, breast lump(s), breast pain and breast self exam. Associated symptoms include abnormal vaginal bleeding. Pertinent negatives include abnormal bleeding (hematology), anxiety, decreased libido, depression, difficulty falling sleep, dyspareunia, history of infertility, nocturia, sexual dysfunction, sleep disturbances, urinary incontinence, urinary urgency, vaginal discharge and vaginal itching. Diet regular.The patient states her exercise level is  intermittent.  Marland Kitchen  The patient's tobacco use is:  Social History   Tobacco Use  Smoking Status Never  Smokeless Tobacco Never  . She has been exposed to passive smoke. The patient's alcohol use is:  Social History   Substance and Sexual Activity  Alcohol Use Not Currently   Review of Systems  Constitutional: Negative.   HENT: Negative.    Eyes: Negative.   Respiratory: Negative.    Cardiovascular: Negative.   Gastrointestinal: Negative.   Endocrine: Negative.   Genitourinary: Negative.   Musculoskeletal: Negative.   Skin: Negative.   Allergic/Immunologic: Negative.   Neurological: Negative.   Hematological: Negative.   Psychiatric/Behavioral: Negative.       Today's Vitals   09/11/22 1417  BP: 120/80  Pulse: 68  Temp: 98.3 F (36.8 C)  Weight: 218 lb 6.4 oz  (99.1 kg)  Height: '5\' 5"'$  (1.651 m)  PainSc: 0-No pain   Body mass index is 36.34 kg/m.  Wt Readings from Last 3 Encounters:  09/11/22 218 lb 6.4 oz (99.1 kg)  09/05/22 218 lb 9.6 oz (99.2 kg)  08/15/22 220 lb 4.8 oz (99.9 kg)     Objective:  Physical Exam Vitals and nursing note reviewed.  Constitutional:      Appearance: Normal appearance.  HENT:     Head: Normocephalic and atraumatic.     Right Ear: Tympanic membrane, ear canal and external ear normal.     Left Ear: Tympanic membrane, ear canal and external ear normal.     Nose:     Comments: Masked     Mouth/Throat:     Comments: Masked  Eyes:     Extraocular Movements: Extraocular movements intact.     Conjunctiva/sclera: Conjunctivae normal.     Pupils: Pupils are equal, round, and reactive to light.  Cardiovascular:     Rate and Rhythm: Normal rate and regular rhythm.     Pulses: Normal pulses.     Heart sounds: Normal heart sounds.  Pulmonary:     Effort: Pulmonary effort is normal.     Breath sounds: Normal breath sounds.  Chest:  Breasts:    Tanner Score is 5.     Comments: She kept her bra on for exam. Abdominal:     General: Abdomen is flat. Bowel sounds are normal.     Palpations: Abdomen is soft.  Genitourinary:    Comments: deferred Musculoskeletal:        General: Normal range of motion.     Cervical back: Normal range of motion and neck supple.  Skin:    General: Skin is warm and dry.  Neurological:     General: No focal deficit present.     Mental Status: She is alert and oriented to person, place, and time.  Psychiatric:        Mood and Affect: Mood normal.        Behavior: Behavior normal.       Assessment And Plan:     1. Encounter for general adult medical examination w/o abnormal findings Comments: A full exam was performed. She is encouraged to perform monthly SBE exams. I will request mammo from [place].  PATIENT IS ADVISED TO GET 30-45 MINUTES REGULAR EXERCISE NO LESS THAN FOUR TO  FIVE DAYS PER WEEK - BOTH WEIGHTBEARING EXERCISES AND AEROBIC ARE RECOMMENDED.  PATIENT IS ADVISED TO FOLLOW A HEALTHY DIET WITH AT LEAST SIX FRUITS/VEGGIES PER DAY, DECREASE INTAKE OF RED MEAT, AND TO INCREASE FISH INTAKE TO TWO DAYS PER WEEK.  MEATS/FISH SHOULD  NOT BE FRIED, BAKED OR BROILED IS PREFERABLE.  IT IS ALSO IMPORTANT TO CUT BACK ON YOUR SUGAR INTAKE. PLEASE AVOID ANYTHING WITH ADDED SUGAR, CORN SYRUP OR OTHER SWEETENERS. IF YOU MUST USE A SWEETENER, YOU CAN TRY STEVIA. IT IS ALSO IMPORTANT TO AVOID ARTIFICIALLY SWEETENERS AND DIET BEVERAGES. LASTLY, I SUGGEST WEARING SPF 50 SUNSCREEN ON EXPOSED PARTS AND ESPECIALLY WHEN IN THE DIRECT SUNLIGHT FOR AN EXTENDED PERIOD OF TIME.  PLEASE AVOID FAST FOOD RESTAURANTS AND INCREASE YOUR WATER INTAKE. - Hemoglobin A1c - Insulin, random(561) - Vitamin D (25 hydroxy)  2. Pure hypercholesterolemia Comments: She is currently on rosuvastatin  '20mg'$  MWF.  3. Other abnormal glucose Comments: Her a1c has been elevated in the past. I will recheck this today. I suspect this will be nl since she is on GLP-1 agonist. - Amb Referral To Provider Referral Exercise Program (P.R.E.P)  4. Class 2 severe obesity due to excess calories with serious comorbidity and body mass index (BMI) of 36.0 to 36.9 in adult [facility]) Comments: She is followed by Cardiology PharmD for obesity, currently on Wegovy. She was congratulated on her progress thus far. - Amb Referral To Provider Referral Exercise Program (P.R.E.P)  5. Immunization due - Flu Vaccine QUAD 6+ mos PF IM (Fluarix Quad PF)  Patient was given opportunity to ask questions. Patient verbalized understanding of the plan and was able to repeat key elements of the plan. All questions were answered to their satisfaction.   I, Maximino Greenland, MD, have reviewed all documentation for this visit. The documentation on 09/11/22 for the exam, diagnosis, procedures, and orders are all accurate and complete.   THE PATIENT IS  ENCOURAGED TO PRACTICE SOCIAL DISTANCING DUE TO THE COVID-19 PANDEMIC.

## 2022-09-11 NOTE — Telephone Encounter (Signed)
Prior auth sent via cover my meds, awaiting determination.

## 2022-09-12 ENCOUNTER — Encounter: Payer: Self-pay | Admitting: Internal Medicine

## 2022-09-12 ENCOUNTER — Other Ambulatory Visit (HOSPITAL_BASED_OUTPATIENT_CLINIC_OR_DEPARTMENT_OTHER): Payer: Self-pay

## 2022-09-12 ENCOUNTER — Other Ambulatory Visit: Payer: Self-pay | Admitting: Internal Medicine

## 2022-09-12 DIAGNOSIS — J984 Other disorders of lung: Secondary | ICD-10-CM

## 2022-09-12 DIAGNOSIS — R911 Solitary pulmonary nodule: Secondary | ICD-10-CM

## 2022-09-12 LAB — HEMOGLOBIN A1C
Est. average glucose Bld gHb Est-mCnc: 111 mg/dL
Hgb A1c MFr Bld: 5.5 % (ref 4.8–5.6)

## 2022-09-12 LAB — INSULIN, RANDOM: INSULIN: 14.7 u[IU]/mL (ref 2.6–24.9)

## 2022-09-12 LAB — VITAMIN D 25 HYDROXY (VIT D DEFICIENCY, FRACTURES): Vit D, 25-Hydroxy: 34.3 ng/mL (ref 30.0–100.0)

## 2022-09-12 MED ORDER — SEMAGLUTIDE-WEIGHT MANAGEMENT 2.4 MG/0.75ML ~~LOC~~ SOAJ
2.4000 mg | SUBCUTANEOUS | 1 refills | Status: DC
  Filled 2022-09-12: qty 3, 28d supply, fill #0
  Filled 2022-10-05: qty 3, 28d supply, fill #1

## 2022-09-12 NOTE — Addendum Note (Signed)
Addended by: Rockne Menghini on: 09/12/2022 11:14 AM   Modules accepted: Orders

## 2022-09-12 NOTE — Telephone Encounter (Signed)
Pt. Sent multiple encounters, disregard

## 2022-09-13 ENCOUNTER — Telehealth: Payer: Self-pay

## 2022-09-13 NOTE — Telephone Encounter (Signed)
Called to discuss PREP program schedule at [facility]; would like next class at [facility] that starts on 1/2, every T/Th 12-1:15; assessment visit scheduled for 12/27 at 2pm

## 2022-09-20 NOTE — Progress Notes (Signed)
[facility] PREP Evaluation  Patient Details  Name: Ashley Burns MRN: [id number] Date of Birth: 19-Jan-1961 Age: 61 y.o. PCP: Glendale Chard, MD  Vitals:   09/20/22 1515  BP: (!) 104/50  Pulse: 65  SpO2: 97%  Weight: 220 lb 3.2 oz (99.9 kg)     [facility] Eval - 09/20/22 1500       [facility] "PREP" Location   [facility] "PREP" Location [facility]      Referral    Referring Provider Sanders    Reason for referral High Cholesterol;Inactivity;Obesitity/Overweight    Program Start Date 09/26/22      Measurement   Waist Circumference 40.5 inches    Hip Circumference 47.5 inches    Body fat 47.8 percent      Information for Trainer   Goals --   Establish exercise routine; lose 20 pounds by end of program   Current Exercise none    Pertinent Medical History --   High cholesterol   Current Barriers --   work     Timed Up and Go (TUGS)   Timed Up and Go Low risk <9 seconds      Mobility and Daily Activities   I find it easy to walk up or down two or more flights of stairs. 3    I have no trouble taking out the trash. 4    I do housework such as vacuuming and dusting on my own without difficulty. 4    I can easily lift a gallon of milk (8lbs). 4    I can easily walk a mile. 4    I have no trouble reaching into high cupboards or reaching down to pick up something from the floor. 4    I do not have trouble doing out-door work such as Armed forces logistics/support/administrative officer, raking leaves, or gardening. 3      Mobility and Daily Activities   I feel younger than my age. 3    I feel independent. 3    I feel energetic. 3    I live an active life.  2    I feel strong. 2    I feel healthy. 3    I feel active as other people my age. 3      How fit and strong are you.   Fit and Strong Total Score 45            Past Medical History:  Diagnosis Date   Arthritis    CAD in native artery 03/18/2022   Hypertension    with pregnancy only   Morbid obesity ([place]) 10/26/2021   PMB (postmenopausal bleeding)     Prediabetes 10/26/2021   Pure hypercholesterolemia 10/26/2021   RBBB 10/26/2021   on ekg   Shortness of breath 10/26/2021   none in 2 months per pt on 05-19-2022   Wears glasses    Past Surgical History:  Procedure Laterality Date   CESAREAN SECTION     x3   COLONOSCOPY  04/2021   DILATATION & CURETTAGE/HYSTEROSCOPY WITH MYOSURE N/A 05/26/2022   Procedure: DILATATION & CURETTAGE/HYSTEROSCOPY WITH MYOSURE RESECTION OF ENDOMETRIAL POLYP;  Surgeon: Servando Salina, MD;  Location: [place];  Service: Gynecology;  Laterality: N/A;   EXCISION MASS LOWER EXTREMETIES Right 05/26/2021   Procedure: EXCISION RIGHT THIGH MASS;  Surgeon: Coralie Keens, MD;  Location: [place];  Service: General;  Laterality: Right;   Social History   Tobacco Use  Smoking Status Never  Smokeless Tobacco Never  To  begin PREP class at [facility] 09/26/22 every T/Th 12-1:15 Nida Manfredi B Oswaldo Cueto 09/20/2022, 3:18 PM

## 2022-09-22 ENCOUNTER — Ambulatory Visit
Admission: RE | Admit: 2022-09-22 | Discharge: 2022-09-22 | Disposition: A | Discharge status: Home / Self Care | Payer: BC Managed Care – PPO | Source: Ambulatory Visit | Attending: Internal Medicine | Admitting: Internal Medicine

## 2022-09-22 DIAGNOSIS — R911 Solitary pulmonary nodule: Secondary | ICD-10-CM

## 2022-10-03 NOTE — Progress Notes (Signed)
Completed intake/assessment visit for PREP on 09/21/23, but has not attended the classes which started 09/26/22.

## 2022-10-05 ENCOUNTER — Other Ambulatory Visit (HOSPITAL_BASED_OUTPATIENT_CLINIC_OR_DEPARTMENT_OTHER): Payer: Self-pay

## 2022-10-26 ENCOUNTER — Other Ambulatory Visit (HOSPITAL_BASED_OUTPATIENT_CLINIC_OR_DEPARTMENT_OTHER): Payer: Self-pay

## 2022-10-26 ENCOUNTER — Telehealth: Payer: Self-pay | Admitting: Cardiovascular Disease

## 2022-10-26 MED ORDER — SEMAGLUTIDE-WEIGHT MANAGEMENT 2.4 MG/0.75ML ~~LOC~~ SOAJ
2.4000 mg | SUBCUTANEOUS | 1 refills | Status: DC
Start: 2022-10-26 — End: 2022-11-24
  Filled 2022-10-26: qty 9, 84d supply, fill #0
  Filled 2022-11-01: qty 3, 28d supply, fill #0

## 2022-10-26 NOTE — Telephone Encounter (Signed)
Left message to call back  

## 2022-10-26 NOTE — Telephone Encounter (Signed)
Spoke with patient regarding Ashley Burns  Stated one of the reasons she was started was prediabetes and according to her plan it will covered for that Advised patient I was not aware of that but would forward to Alena Bills D for review

## 2022-10-26 NOTE — Telephone Encounter (Signed)
Pt c/o medication issue:  1. Name of Medication: Wegovy   2. How are you currently taking this medication (dosage and times per day)?   3. Are you having a reaction (difficulty breathing--STAT)?   4. What is your medication issue? Pt is requesting call back to discuss this medication and it's coverage. Please advise.

## 2022-10-26 NOTE — Telephone Encounter (Signed)
Patient is returning call.  °

## 2022-11-01 ENCOUNTER — Other Ambulatory Visit (HOSPITAL_BASED_OUTPATIENT_CLINIC_OR_DEPARTMENT_OTHER): Payer: Self-pay

## 2022-11-20 ENCOUNTER — Telehealth: Payer: Self-pay | Admitting: Cardiovascular Disease

## 2022-11-20 NOTE — Telephone Encounter (Signed)
Pt c/o medication issue:  1. Name of Medication:   Semaglutide-Weight Management 2.4 MG/0.75ML SOAJ   2. How are you currently taking this medication (dosage and times per day)?   3. Are you having a reaction (difficulty breathing--STAT)?   4. What is your medication issue? Pt called in asking to speak with you about this medication.

## 2022-11-24 ENCOUNTER — Other Ambulatory Visit (HOSPITAL_BASED_OUTPATIENT_CLINIC_OR_DEPARTMENT_OTHER): Payer: Self-pay

## 2022-11-24 MED ORDER — SEMAGLUTIDE-WEIGHT MANAGEMENT 2.4 MG/0.75ML ~~LOC~~ SOAJ
2.4000 mg | SUBCUTANEOUS | 1 refills | Status: DC
  Filled 2022-11-24: qty 9, 84d supply, fill #0
  Filled 2022-11-30: qty 3, 28d supply, fill #0
  Filled 2022-12-18: qty 3, 28d supply, fill #1

## 2022-11-24 NOTE — Telephone Encounter (Signed)
Spoke with patient.  She is aware insurance coverage will end on April 1.  She is looking into her husband's insurance, as she is also on his plan, to see if she can get it through them.   In the meantime will send refill for 2.4 mg dose to [facility] pharmacy at [facility] for 3 month supply.

## 2022-11-30 ENCOUNTER — Other Ambulatory Visit (HOSPITAL_BASED_OUTPATIENT_CLINIC_OR_DEPARTMENT_OTHER): Payer: Self-pay

## 2022-12-04 ENCOUNTER — Telehealth: Payer: Self-pay | Admitting: Cardiovascular Disease

## 2022-12-04 NOTE — Telephone Encounter (Signed)
Pt c/o medication issue:  1. Name of Medication: Semaglutide-Weight Management 2.4 MG/0.75ML SOAJ   2. How are you currently taking this medication (dosage and times per day)? Inject once a week  3. Are you having a reaction (difficulty breathing--STAT)? no  4. What is your medication issue? Patient states the medication was supposed to be approved through December. She says she thought it was physically based. She says she received a mychart message saying she was only eligible through the end of the month.

## 2022-12-11 ENCOUNTER — Other Ambulatory Visit: Payer: Self-pay | Admitting: Internal Medicine

## 2022-12-18 ENCOUNTER — Other Ambulatory Visit (HOSPITAL_BASED_OUTPATIENT_CLINIC_OR_DEPARTMENT_OTHER): Payer: Self-pay

## 2022-12-27 NOTE — Telephone Encounter (Signed)
Patient is calling back to get update on prior auth that was suppose to be sent in for this medication. Requesting call back.   For prior auth [phone]

## 2022-12-27 NOTE — Telephone Encounter (Signed)
Spoke with patient and she stated she spoke with her insurance company regarding the medication  Explained she was unable to get medication secondary to the pharmacy not having available while it was still covered.  Insurance company led her to believe she should be able to get PA or a formulary exception since not available  Will forward to [employer] D and PA team for review

## 2022-12-28 ENCOUNTER — Other Ambulatory Visit (HOSPITAL_COMMUNITY): Payer: Self-pay

## 2022-12-28 ENCOUNTER — Other Ambulatory Visit (HOSPITAL_BASED_OUTPATIENT_CLINIC_OR_DEPARTMENT_OTHER): Payer: Self-pay

## 2022-12-28 MED ORDER — ZEPBOUND 10 MG/0.5ML ~~LOC~~ SOAJ
10.0000 mg | SUBCUTANEOUS | 0 refills | Status: DC
  Filled 2022-12-28: qty 2, 28d supply, fill #0

## 2022-12-28 MED ORDER — ZEPBOUND 15 MG/0.5ML ~~LOC~~ SOAJ
15.0000 mg | SUBCUTANEOUS | 2 refills | Status: DC
  Filled 2022-12-28: qty 2, 28d supply, fill #0

## 2022-12-28 MED ORDER — ZEPBOUND 12.5 MG/0.5ML ~~LOC~~ SOAJ
12.5000 mg | SUBCUTANEOUS | 0 refills | Status: DC
  Filled 2022-12-28: qty 2, 28d supply, fill #0

## 2022-12-28 NOTE — Telephone Encounter (Signed)
Per test claim no PA is required for Zepbound. Patient will have a high cost on any weight loss drug due to plan restrictions with or without copay assistance.

## 2022-12-28 NOTE — Addendum Note (Signed)
Addended by: Rockne Menghini on: 12/28/2022 02:24 PM   Modules accepted: Orders

## 2022-12-28 NOTE — Telephone Encounter (Signed)
Spoke with patient.  She has been doing [place] every 10 days to make it last.  Will have her finish current supply of 2.4 mg then switch to Zepbound 10 mg and titrate up to 15 mg over 3 months.   Patient voiced understanding.  Will go to [employer] to get coupon for $550/month pricing

## 2023-02-23 ENCOUNTER — Encounter (HOSPITAL_BASED_OUTPATIENT_CLINIC_OR_DEPARTMENT_OTHER): Payer: Self-pay | Admitting: Cardiovascular Disease

## 2023-02-23 ENCOUNTER — Ambulatory Visit (HOSPITAL_BASED_OUTPATIENT_CLINIC_OR_DEPARTMENT_OTHER): Payer: BC Managed Care – PPO | Admitting: Cardiovascular Disease

## 2023-02-23 ENCOUNTER — Other Ambulatory Visit (HOSPITAL_BASED_OUTPATIENT_CLINIC_OR_DEPARTMENT_OTHER): Payer: Self-pay

## 2023-02-23 VITALS — BP 100/69 | HR 59 | Ht 65.0 in | Wt 213.0 lb

## 2023-02-23 DIAGNOSIS — I2510 Atherosclerotic heart disease of native coronary artery without angina pectoris: Secondary | ICD-10-CM

## 2023-02-23 DIAGNOSIS — I4510 Unspecified right bundle-branch block: Secondary | ICD-10-CM | POA: Diagnosis not present

## 2023-02-23 DIAGNOSIS — E7800 Pure hypercholesterolemia, unspecified: Secondary | ICD-10-CM | POA: Diagnosis not present

## 2023-02-23 DIAGNOSIS — R001 Bradycardia, unspecified: Secondary | ICD-10-CM

## 2023-02-23 LAB — COMPREHENSIVE METABOLIC PANEL
ALT: 12 IU/L (ref 0–32)
AST: 16 IU/L (ref 0–40)
Albumin/Globulin Ratio: 1.6 (ref 1.2–2.2)
Albumin: 4.4 g/dL (ref 3.9–4.9)
Alkaline Phosphatase: 83 IU/L (ref 44–121)
BUN/Creatinine Ratio: 18 (ref 12–28)
BUN: 16 mg/dL (ref 8–27)
Bilirubin Total: 0.6 mg/dL (ref 0.0–1.2)
CO2: 22 mmol/L (ref 20–29)
Calcium: 10.2 mg/dL (ref 8.7–10.3)
Chloride: 104 mmol/L (ref 96–106)
Creatinine, Ser: 0.88 mg/dL (ref 0.57–1.00)
Globulin, Total: 2.8 g/dL (ref 1.5–4.5)
Glucose: 85 mg/dL (ref 70–99)
Potassium: 4.5 mmol/L (ref 3.5–5.2)
Sodium: 142 mmol/L (ref 134–144)
Total Protein: 7.2 g/dL (ref 6.0–8.5)
eGFR: 75 mL/min/{1.73_m2} (ref >59)

## 2023-02-23 LAB — LIPID PANEL
Chol/HDL Ratio: 3.1 ratio (ref 0.0–4.4)
Cholesterol, Total: 177 mg/dL (ref 100–199)
HDL: 57 mg/dL (ref >39)
LDL Chol Calc (NIH): 108 mg/dL — ABNORMAL HIGH (ref 0–99)
Triglycerides: 62 mg/dL (ref 0–149)
VLDL Cholesterol Cal: 12 mg/dL (ref 5–40)

## 2023-02-23 MED ORDER — ZEPBOUND 12.5 MG/0.5ML ~~LOC~~ SOAJ
12.5000 mg | SUBCUTANEOUS | 0 refills | Status: AC
  Filled 2023-02-23 – 2023-03-26 (×2): qty 2, 28d supply, fill #0

## 2023-02-23 MED ORDER — ZEPBOUND 15 MG/0.5ML ~~LOC~~ SOAJ
15.0000 mg | SUBCUTANEOUS | 2 refills | Status: DC
  Filled 2023-02-23 – 2023-04-20 (×2): qty 2, 28d supply, fill #0
  Filled 2023-05-18: qty 2, 28d supply, fill #1
  Filled 2023-06-15: qty 2, 28d supply, fill #2

## 2023-02-23 NOTE — Patient Instructions (Addendum)
Medication Instructions:  START ZEPBOUND 12.5 MG WEEKLY FOR 4 WEEKS THEN INCREASE TO 15 MG WEEKLY   *If you need a refill on your cardiac medications before your next appointment, please call your pharmacy*  Lab Work: LP/CMET TODAY   If you have labs (blood work) drawn today and your tests are completely normal, you will receive your results only by: MyChart Message (if you have MyChart) OR A paper copy in the mail If you have any lab test that is abnormal or we need to change your treatment, we will call you to review the results.  Testing/Procedures: NONE  Follow-Up: At [facility], you and your health needs are our priority.  As part of our continuing mission to provide you with exceptional heart care, we have created designated Provider Care Teams.  These Care Teams include your primary Cardiologist (physician) and Advanced Practice Providers (APPs -  Physician Assistants and Nurse Practitioners) who all work together to provide you with the care you need, when you need it.  We recommend signing up for the patient portal called "MyChart".  Sign up information is provided on this After Visit Summary.  MyChart is used to connect with patients for Virtual Visits (Telemedicine).  Patients are able to view lab/test results, encounter notes, upcoming appointments, etc.  Non-urgent messages can be sent to your provider as well.   To learn more about what you can do with MyChart, go to [email or web address].    Your next appointment:   12 month(s)  Provider:   Chilton Si, MD

## 2023-02-23 NOTE — Progress Notes (Signed)
Cardiology Office Note:    Date:  02/23/2023   ID:  KATH LAMOND, DOB [birth year], MRN [id number]  PCP:  Dorothyann Peng, MD  Cardiologist:  None    Referring MD: Dorothyann Peng, MD   No chief complaint on file.   History of Present Illness:    Ashley Burns is a 62 y.o. female with a hx of non-obstructive CAD, hypertensive disorder of pregnancy and RBBB here for follow-up.  She was seen 10/2021 for the evaluation of shortness of breath, palpitations, and family history of heart disease at the request of Dr. Allyne Gee.  She last saw Dr. Allyne Gee 07/2021 for annual physical exam. She complained of shortness of breath at rest and palpitations lasting a few months prior. She also endorsed a family history of heart disease on her father's side. She was referred to cardiology.  Her brother used crutches due to neonatal leg injuries and had various MIs and passed away in his 30s. Her father also had cardiovascular disease.   We discussed focusing on diet and exercise.  We also started her on Ozempic.  Coronary calcium score 11/2021 revealed a score of 5, which was 75th percentile for age and gender.  She was started on rosuvastatin. At her visit 07/2022 she was feeling well and losing weight.  History of Present Illness   Ashley Burns reports significant improvement in her overall health since the last visit, with no current complaints of chest pain, pressure, or swelling in the legs or feet. She has been adhering to her prescribed medication regimen, including rosuvastatin, and has made dietary changes, such as increased salmon intake.  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 She also reports feeling lethargic and struggles with maintaining adequate sleep, averaging about six hours per night.  The patient has been on a weight loss regimen, including the use of Wegovy, which she  recently finished. She has not yet started her prescribed Zepbound. She reports the only side effect from [facility] was increased gas.  She tolerated Weygovy well and had to switch due to insurance reasons.  Despite her progress, the patient expresses a desire to improve her energy levels and incorporate more exercise into her routine. She acknowledges the need to prioritize her health and well-being amidst her busy schedule.       Past Medical History:  Diagnosis Date   Arthritis    CAD in native artery 03/18/2022   Hypertension    with pregnancy only   Morbid obesity (HCC) 10/26/2021   PMB (postmenopausal bleeding)    Prediabetes 10/26/2021   Pure hypercholesterolemia 10/26/2021   RBBB 10/26/2021   on ekg   Shortness of breath 10/26/2021   none in 2 months per pt on 05-19-2022   Wears glasses     Past Surgical History:  Procedure Laterality Date   CESAREAN SECTION     x3   COLONOSCOPY  04/2021   DILATATION & CURETTAGE/HYSTEROSCOPY WITH MYOSURE N/A 05/26/2022   Procedure: DILATATION & CURETTAGE/HYSTEROSCOPY WITH MYOSURE RESECTION OF ENDOMETRIAL POLYP;  Surgeon: Maxie Better, MD;  Location: [facility];  Service: Gynecology;  Laterality: N/A;   EXCISION MASS LOWER EXTREMETIES Right 05/26/2021   Procedure: EXCISION RIGHT THIGH MASS;  Surgeon: Abigail Miyamoto, MD;  Location: [facility] OR;  Service: General;  Laterality: Right;    Current Medications: Current Meds  Medication Sig   BIOTIN PO Take  2,500 mcg by mouth daily. Hair/skin/nails   diclofenac Sodium (VOLTAREN) 1 % GEL Apply 2 g topically 4 (four) times daily.   ELDERBERRY PO Take by mouth daily. Ran out of   Ferrous Sulfate (IRON PO) Take 325 mg by mouth daily.   Multiple Vitamin (MULTIVITAMIN ADULT PO) Take 1 tablet by mouth daily at 12 noon.   Omega-3 Fatty Acids (FISH OIL PO) Take by mouth. 3 x weekly   rosuvastatin (CRESTOR) 20 MG tablet TAKE 1 TABLET MON,WED, AND FRI   TURMERIC PO Take 1 tablet by mouth  daily.   zinc gluconate 50 MG tablet Take 50 mg by mouth once a week.   [DISCONTINUED] tirzepatide (ZEPBOUND) 10 MG/0.5ML Pen Inject 10 mg into the skin once a week.   [DISCONTINUED] tirzepatide (ZEPBOUND) 12.5 MG/0.5ML Pen Inject 12.5 mg into the skin once a week.   [DISCONTINUED] tirzepatide (ZEPBOUND) 15 MG/0.5ML Pen Inject 15 mg into the skin once a week.     Allergies:   Shellfish allergy   Social History   Socioeconomic History   Marital status: Married    Spouse name: Not on file   Number of children: Not on file   Years of education: Not on file   Highest education level: Not on file  Occupational History   Not on file  Tobacco Use   Smoking status: Never   Smokeless tobacco: Never  Vaping Use   Vaping Use: Never used  Substance and Sexual Activity   Alcohol use: Not Currently   Drug use: No   Sexual activity: Not on file  Other Topics Concern   Not on file  Social History Narrative   Not on file   Social Determinants of Health   Financial Resource Strain: Low Risk  (10/26/2021)   Overall Financial Resource Strain (CARDIA)    Difficulty of Paying Living Expenses: Not hard at all  Food Insecurity: No Food Insecurity (10/26/2021)   Hunger Vital Sign    Worried About Running Out of Food in the Last Year: Never true    Ran Out of Food in the Last Year: Never true  Transportation Needs: No Transportation Needs (10/26/2021)   PRAPARE - Administrator, Civil Service (Medical): No    Lack of Transportation (Non-Medical): No  Physical Activity: Inactive (10/26/2021)   Exercise Vital Sign    Days of Exercise per Week: 0 days    Minutes of Exercise per Session: 0 min  Stress: Not on file  Social Connections: Not on file     Family History: The patient's family history includes Cancer in her cousin and mother; Diabetes in her father; Heart attack in her brother and father; Heart disease in her father; Hypertension in her father. There is no history of Colon  cancer, Esophageal cancer, Stomach cancer, or Rectal cancer.  ROS:   Please see the history of present illness.  (+) Dry mouth/lips (+) mild swelling of lips All other systems reviewed and negative.   EKGs/Labs/Other Studies Reviewed:    The following studies were reviewed today: Lower Extremity Duplex 09/05/12 - No evidence of deep vein thrombosis involving the    visualized veins of the left lower extremity.  - No evidence of Baker's cyst on the left.   Coronary Calcium score 11/2021:  IMPRESSION: 1. Coronary calcium score of 5. This was 75th percentile for age, gender, and race matched controls.  EKG:  EKG is personally reviewed. 08/15/2022: Sinus rhythm. Rate 60 bpm. LFAC. RBBB. 10/26/21: Sinus  bradycardia, rate 55 bpm; LAFB and RBBB  Recent Labs: 05/26/2022: Hemoglobin 13.2; Platelets 225 08/16/2022: ALT 11; BUN 9; Creatinine, Ser 0.72; Potassium 4.1; Sodium 144   Recent Lipid Panel    Component Value Date/Time   CHOL 141 08/16/2022 1039   TRIG 54 08/16/2022 1039   HDL 49 08/16/2022 1039   CHOLHDL 2.9 08/16/2022 1039   LDLCALC 80 08/16/2022 1039    Physical Exam:    VS:  BP 100/69 (BP Location: Left Arm, Patient Position: Sitting, Cuff Size: Large)   Pulse (!) 59   Ht 5\' 5"  (1.651 m)   Wt 213 lb (96.6 kg)   LMP 11/06/2012   BMI 35.45 kg/m  , BMI Body mass index is 35.45 kg/m. GENERAL:  Well appearing HEENT: Pupils equal round and reactive, fundi not visualized, oral mucosa unremarkable NECK:  No jugular venous distention, waveform within normal limits, carotid upstroke brisk and symmetric, no bruits, no thyromegaly LUNGS:  Clear to auscultation bilaterally HEART:  Bradycardic.  PMI not displaced or sustained,S1 and S2 within normal limits, no S3, no S4, no clicks, no rubs, no murmurs ABD:  Flat, positive bowel sounds normal in frequency in pitch, no bruits, no rebound, no guarding, no midline pulsatile mass, no hepatomegaly, no splenomegaly EXT:  2 plus pulses  throughout, trace edema, no cyanosis no clubbing SKIN:  No rashes no nodules NEURO:  Cranial nerves II through XII grossly intact, motor grossly intact throughout PSYCH:  Cognitively intact, oriented to person place and time  ASSESSMENT:    1. CAD in native artery   2. RBBB   3. Pure hypercholesterolemia   4. Morbid obesity (HCC)   5. Bradycardia      PLAN:    #  Obesity:  # Weight Management: Significant progress in weight loss, but patient acknowledges lack of regular exercise due to busy schedule. No reported adverse symptoms during physical activity.  Transitioning from [facility] to [place] for weight management. Patient has finished [place] and has not started Zepbound yet. -Start Zepbound at 12.5mg  and adjust as tolerated. -Encouraged to incorporate regular exercise into daily routine, possibly during son's swim practice or early morning walks. -Continue current diet and lifestyle modifications.   # Non-obstructive CAD: # Hyperlipidemia: On Rosuvastatin 20mg  MWF. Last LDL was 80, goal is under 70 due to small amount of plaque on CT scan. -Continue Rosuvastatin 20mg  MWF. -Check lipid panel today to assess current status. - Increase exercise to at least 150 minutes weekly.       Disposition: FU with Keanon Bevins C. Duke Salvia, MD, [facility] in 6 months  Medication Adjustments/Labs and Tests Ordered: Current medicines are reviewed at length with the patient today.  Concerns regarding medicines are outlined above.  Orders Placed This Encounter  Procedures   Lipid panel   Comprehensive metabolic panel   Meds ordered this encounter  Medications   tirzepatide (ZEPBOUND) 15 MG/0.5ML Pen    Sig: Inject 15 mg into the skin once a week.    Dispense:  2 mL    Refill:  2   tirzepatide (ZEPBOUND) 12.5 MG/0.5ML Pen    Sig: Inject 12.5 mg into the skin once a week for 21 days.    Dispense:  2 mL    Refill:  0     Signed, Chilton Si, MD  02/23/2023 10:17 AM    [facility]

## 2023-02-26 ENCOUNTER — Telehealth (HOSPITAL_BASED_OUTPATIENT_CLINIC_OR_DEPARTMENT_OTHER): Payer: Self-pay

## 2023-02-26 DIAGNOSIS — E7800 Pure hypercholesterolemia, unspecified: Secondary | ICD-10-CM

## 2023-02-26 MED ORDER — ROSUVASTATIN CALCIUM 20 MG PO TABS: 20.0000 mg | ORAL_TABLET | Freq: Every day | ORAL | 3 refills | Status: DC

## 2023-02-26 NOTE — Telephone Encounter (Addendum)
Called patient, she states she was missing doses regularly, she will trial 20mg  daily and have labs repeated in two months. Labs ordered and mailed to home.     ----- Message from Alver Sorrow, NP sent at 02/25/2023  7:03 PM EDT ----- Normal kidneys, liver, electrolytes. Cholesterol increased from prior. LDL of 108 is far above goal of <70.   Can either: -Increase Rosuvastatin to 20mg  daily with FLP/LFT in 2 months Or -Continue Rosuvastatin 20mg  3x/week and add Zetia 10mg  daily with FLP/LFT in 2 months

## 2023-03-01 ENCOUNTER — Other Ambulatory Visit (HOSPITAL_BASED_OUTPATIENT_CLINIC_OR_DEPARTMENT_OTHER): Payer: Self-pay

## 2023-03-02 ENCOUNTER — Ambulatory Visit: Payer: BC Managed Care – PPO | Admitting: Internal Medicine

## 2023-03-02 ENCOUNTER — Encounter: Payer: Self-pay | Admitting: Internal Medicine

## 2023-03-02 VITALS — BP 106/72 | HR 69 | Temp 97.8°F | Ht 65.0 in | Wt 212.6 lb

## 2023-03-02 DIAGNOSIS — K5901 Slow transit constipation: Secondary | ICD-10-CM

## 2023-03-02 DIAGNOSIS — M25561 Pain in right knee: Secondary | ICD-10-CM

## 2023-03-02 DIAGNOSIS — R195 Other fecal abnormalities: Secondary | ICD-10-CM | POA: Diagnosis not present

## 2023-03-02 DIAGNOSIS — R1032 Left lower quadrant pain: Secondary | ICD-10-CM

## 2023-03-02 DIAGNOSIS — Z6835 Body mass index (BMI) 35.0-35.9, adult: Secondary | ICD-10-CM

## 2023-03-02 DIAGNOSIS — R911 Solitary pulmonary nodule: Secondary | ICD-10-CM

## 2023-03-02 DIAGNOSIS — E7800 Pure hypercholesterolemia, unspecified: Secondary | ICD-10-CM

## 2023-03-02 DIAGNOSIS — E6609 Other obesity due to excess calories: Secondary | ICD-10-CM

## 2023-03-02 LAB — POCT URINALYSIS DIPSTICK
Bilirubin, UA: NEGATIVE
Blood, UA: NEGATIVE
Glucose, UA: NEGATIVE
Ketones, UA: NEGATIVE
Leukocytes, UA: NEGATIVE
Nitrite, UA: NEGATIVE
Protein, UA: NEGATIVE
Spec Grav, UA: >=1.03 — AB (ref 1.010–1.025)
Urobilinogen, UA: 0.2 E.U./dL
pH, UA: 5.5 (ref 5.0–8.0)

## 2023-03-02 NOTE — Progress Notes (Signed)
Subjective:  Patient ID: Ashley Burns , female    DOB: June 21, 1961 , 62 y.o.   MRN: [id number]  Chief Complaint  Patient presents with   Abdominal Pain    HPI  Patient presents today for lower left abdominal pain. She reports this pain initially started 4 days ago. She admits the pain has suddenly stopped today. Described as a sharp, stabbing pain. She is not sure what could have triggered her symptoms. 4/10 pain scale. Not related to food intake. Sometimes she had the pain upon awakening. She is not sure if she has had a bowel movement recently, she thinks she had one earlier this week.          Past Medical History:  Diagnosis Date   Arthritis    CAD in native artery 03/18/2022   Hypertension    with pregnancy only   Morbid obesity (HCC) 10/26/2021   PMB (postmenopausal bleeding)    Prediabetes 10/26/2021   Pure hypercholesterolemia 10/26/2021   RBBB 10/26/2021   on ekg   Shortness of breath 10/26/2021   none in 2 months per pt on 05-19-2022   Wears glasses      Family History  Problem Relation Age of Onset   Cancer Mother        breast   Heart attack Father    Diabetes Father    Hypertension Father    Heart disease Father    Heart attack Brother    Cancer Cousin        breast   Colon cancer Neg Hx    Esophageal cancer Neg Hx    Stomach cancer Neg Hx    Rectal cancer Neg Hx      Current Outpatient Medications:    BIOTIN PO, Take 2,500 mcg by mouth daily. Hair/skin/nails, Disp: , Rfl:    diclofenac Sodium (VOLTAREN) 1 % GEL, Apply 2 g topically 4 (four) times daily., Disp: 100 g, Rfl: 2   Ferrous Sulfate (IRON PO), Take 325 mg by mouth daily., Disp: , Rfl:    Omega-3 Fatty Acids (FISH OIL PO), Take by mouth. 3 x weekly, Disp: , Rfl:    rosuvastatin (CRESTOR) 20 MG tablet, Take 1 tablet (20 mg total) by mouth daily., Disp: 90 tablet, Rfl: 3   TURMERIC PO, Take 1 tablet by mouth daily., Disp: , Rfl:    zinc gluconate 50 MG tablet, Take 50 mg by mouth  once a week., Disp: , Rfl:    ELDERBERRY PO, Take by mouth daily. Ran out of (Patient not taking: Reported on 03/02/2023), Disp: , Rfl:    Multiple Vitamin (MULTIVITAMIN ADULT PO), Take 1 tablet by mouth daily at 12 noon. (Patient not taking: Reported on 03/02/2023), Disp: , Rfl:    tirzepatide (ZEPBOUND) 12.5 MG/0.5ML Pen, Inject 12.5 mg into the skin once a week for 21 days. (Patient not taking: Reported on 03/02/2023), Disp: 2 mL, Rfl: 0   tirzepatide (ZEPBOUND) 15 MG/0.5ML Pen, Inject 15 mg into the skin once a week. (Patient not taking: Reported on 03/02/2023), Disp: 2 mL, Rfl: 2   Allergies  Allergen Reactions   Shellfish Allergy     itching     Review of Systems  Constitutional: Negative.   Respiratory: Negative.    Cardiovascular: Negative.   Gastrointestinal:  Positive for constipation.  Musculoskeletal:  Positive for arthralgias.  Neurological: Negative.   Psychiatric/Behavioral: Negative.       Today's Vitals   03/02/23 1514  BP: 106/72  Pulse: 69  Temp: 97.8 F (36.6 C)  SpO2: 98%  Weight: 212 lb 9.6 oz (96.4 kg)  Height: 5\' 5"  (1.651 m)   Body mass index is 35.38 kg/m.  Wt Readings from Last 3 Encounters:  03/02/23 212 lb 9.6 oz (96.4 kg)  02/23/23 213 lb (96.6 kg)  09/20/22 220 lb 3.2 oz (99.9 kg)    The 10-year ASCVD risk score (Arnett DK, et al., 2019) is: 3%   Values used to calculate the score:     Age: 63 years     Sex: Female     Is Non-Hispanic African American: Yes     Diabetic: No     Tobacco smoker: No     Systolic Blood Pressure: 106 mmHg     Is BP treated: No     HDL Cholesterol: 57 mg/dL     Total Cholesterol: 177 mg/dL  Objective:  Physical Exam Vitals and nursing note reviewed.  Constitutional:      Appearance: Normal appearance. She is well-developed. She is obese.  HENT:     Head: Normocephalic and atraumatic.  Cardiovascular:     Rate and Rhythm: Normal rate and regular rhythm.     Heart sounds: Normal heart sounds.  Pulmonary:      Effort: Pulmonary effort is normal.     Breath sounds: Normal breath sounds.  Abdominal:     General: Bowel sounds are normal. There is no distension.     Palpations: Abdomen is soft.     Tenderness: There is no rebound.  Skin:    General: Skin is warm.  Neurological:     General: No focal deficit present.     Mental Status: She is alert.  Psychiatric:        Mood and Affect: Mood normal.        Behavior: Behavior normal.      Assessment And Plan:  1. Left lower quadrant abdominal pain Comments: Exam is benign. Sx will likely improve if we can improve her bowel regularity. - POCT Urinalysis Dipstick (81002)  2. Change in stool caliber Comments: Possibly related to constipation. if persistent, wil consider GI evaluation.  3. Slow transit constipation Comments: Advised to try Miralax every other day. Also advised to incorporate blackberries and apples into her diet.  4. Recurrent pain of right knee Comments: She wants to go to Knee specialist for injections. Sounds like she is referring to PRP therapy. I have no issue with pursuing this.  5. Lung nodule seen on imaging study Comments: 1.7 cm ground-glass nodule in the medial perihilar right lower seen on chest CT Dec 2023. Will repeat CT for f/u.  6. Class 2 obesity due to excess calories without serious comorbidity with body mass index (BMI) of 35.0 to 35.9 in adult Comments: She is now on Zepbound as per Cardiology. She has chosen to pay out of pocket.  She was congratulated on her 8lb weight loss in the past six months.    Return if symptoms worsen or fail to improve.  Patient was given opportunity to ask questions. Patient verbalized understanding of the plan and was able to repeat key elements of the plan. All questions were answered to their satisfaction.   I, Gwynneth Aliment, MD, have reviewed all documentation for this visit. The documentation on 03/02/23 for the exam, diagnosis, procedures, and orders are all accurate  and complete.   IF YOU HAVE BEEN REFERRED TO A SPECIALIST, IT MAY TAKE 1-2 WEEKS TO SCHEDULE/PROCESS THE REFERRAL. IF YOU  HAVE NOT HEARD FROM US/SPECIALIST IN TWO WEEKS, PLEASE GIVE [place] A CALL AT [phone].

## 2023-03-02 NOTE — Patient Instructions (Addendum)
Start Miralax every other day  Constipation, Adult Constipation is when a person has trouble pooping (having a bowel movement). When you have this condition, you may poop fewer than 3 times a week. Your poop (stool) may also be dry, hard, or bigger than normal. Follow these instructions at home: Eating and drinking  Eat foods that have a lot of fiber, such as: Fresh fruits and vegetables. Whole grains. Beans. Eat less of foods that are low in fiber and high in fat and sugar, such as: [place] fries. Hamburgers. Cookies. Candy. Soda. Drink enough fluid to keep your pee (urine) pale yellow. General instructions Exercise regularly or as told by your doctor. Try to do 150 minutes of exercise each week. Go to the restroom when you feel like you need to poop. Do not hold it in. Take over-the-counter and prescription medicines only as told by your doctor. These include any fiber supplements. When you poop: Do deep breathing while relaxing your lower belly (abdomen). Relax your pelvic floor. The pelvic floor is a group of muscles that support the rectum, bladder, and intestines (as well as the uterus in women). Watch your condition for any changes. Tell your doctor if you notice any. Keep all follow-up visits as told by your doctor. This is important. Contact a doctor if: You have pain that gets worse. You have a fever. You have not pooped for 4 days. You vomit. You are not hungry. You lose weight. You are bleeding from the opening of the butt (anus). You have thin, pencil-like poop. Get help right away if: You have a fever, and your symptoms suddenly get worse. You leak poop or have blood in your poop. Your belly feels hard or bigger than normal (bloated). You have very bad belly pain. You feel dizzy or you faint. Summary Constipation is when a person poops fewer than 3 times a week, has trouble pooping, or has poop that is dry, hard, or bigger than normal. Eat foods that have a  lot of fiber. Drink enough fluid to keep your pee (urine) pale yellow. Take over-the-counter and prescription medicines only as told by your doctor. These include any fiber supplements. This information is not intended to replace advice given to you by your health care provider. Make sure you discuss any questions you have with your health care provider. Document Revised: 07/26/2022 Document Reviewed: 07/26/2022 Elsevier Patient Education  2024 Elsevier Inc. Abdominal Pain, Adult  Pain in the abdomen (abdominal pain) can be caused by many things. In most cases, it gets better with no treatment or by being treated at home. But in some cases, it can be serious. Your health care provider will ask questions about your medical history and do a physical exam to try to figure out what is causing your pain. Follow these instructions at home: Medicines Take over-the-counter and prescription medicines only as told by your provider. Do not take medicines that help you poop (laxatives) unless told by your provider. General instructions Watch your condition for any changes. Drink enough fluid to keep your pee (urine) pale yellow. Contact a health care provider if: Your pain changes, gets worse, or lasts longer than expected. You have severe cramping or bloating in your abdomen, or you vomit. Your pain gets worse with meals, after eating, or with certain foods. You are constipated or have diarrhea for more than 2-3 days. You are not hungry, or you lose weight without trying. You have signs of dehydration. These may include: Dark pee, very  little pee, or no pee. Cracked lips or dry mouth. Sleepiness or weakness. You have pain when you pee (urinate) or poop. Your abdominal pain wakes you up at night. You have blood in your pee. You have a fever. Get help right away if: You cannot stop vomiting. Your pain is only in one part of the abdomen. Pain on the right side could be caused by  appendicitis. You have bloody or black poop (stool), or poop that looks like tar. You have trouble breathing. You have chest pain. These symptoms may be an emergency. Get help right away. Call 911. Do not wait to see if the symptoms will go away. Do not drive yourself to the hospital. This information is not intended to replace advice given to you by your health care provider. Make sure you discuss any questions you have with your health care provider. Document Revised: 06/28/2022 Document Reviewed: 06/28/2022 Elsevier Patient Education  2024 [employer].

## 2023-03-05 ENCOUNTER — Encounter: Payer: Self-pay | Admitting: Internal Medicine

## 2023-03-11 ENCOUNTER — Other Ambulatory Visit: Payer: Self-pay | Admitting: Internal Medicine

## 2023-03-11 DIAGNOSIS — M25561 Pain in right knee: Secondary | ICD-10-CM | POA: Insufficient documentation

## 2023-03-11 DIAGNOSIS — K5901 Slow transit constipation: Secondary | ICD-10-CM | POA: Insufficient documentation

## 2023-03-11 DIAGNOSIS — R1032 Left lower quadrant pain: Secondary | ICD-10-CM | POA: Insufficient documentation

## 2023-03-11 DIAGNOSIS — R195 Other fecal abnormalities: Secondary | ICD-10-CM | POA: Insufficient documentation

## 2023-03-11 DIAGNOSIS — R911 Solitary pulmonary nodule: Secondary | ICD-10-CM

## 2023-03-11 IMAGING — US US EXTREM LOW*R* LIMITED
1 series · 9 of 9 positions shown · non-contrast
Comparison: None.

CLINICAL DATA: Right lateral hip lump.

EXAM:
ULTRASOUND RIGHT LOWER EXTREMITY LIMITED
TECHNIQUE: Ultrasound examination of the lower extremity soft tissues was
performed in the area of clinical concern.

[Series 1: us extrem low*right* limited · 0.13mm/px · 9 acquisitions, 9 frames shown]
[im 1/9]
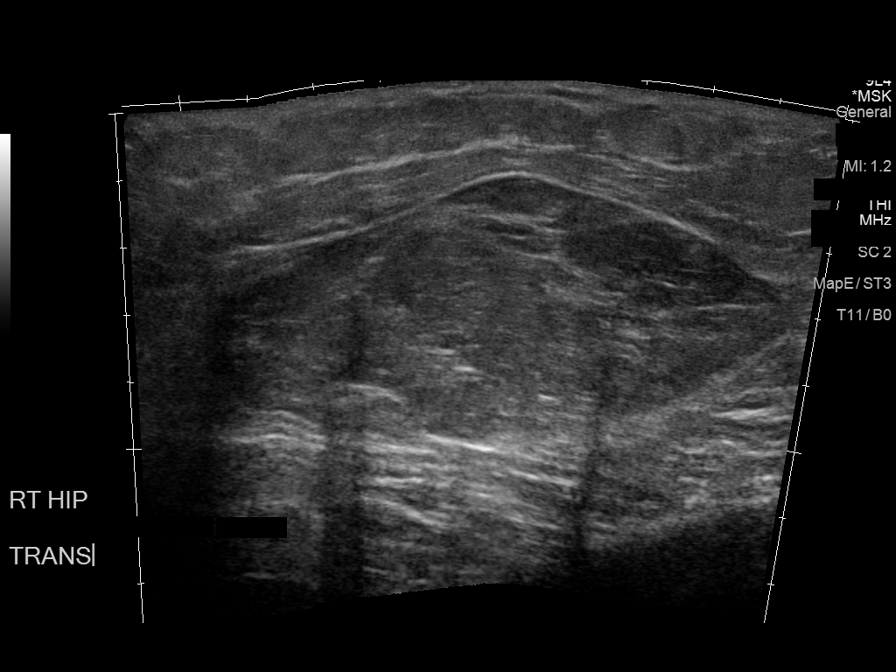
[im 2/9]
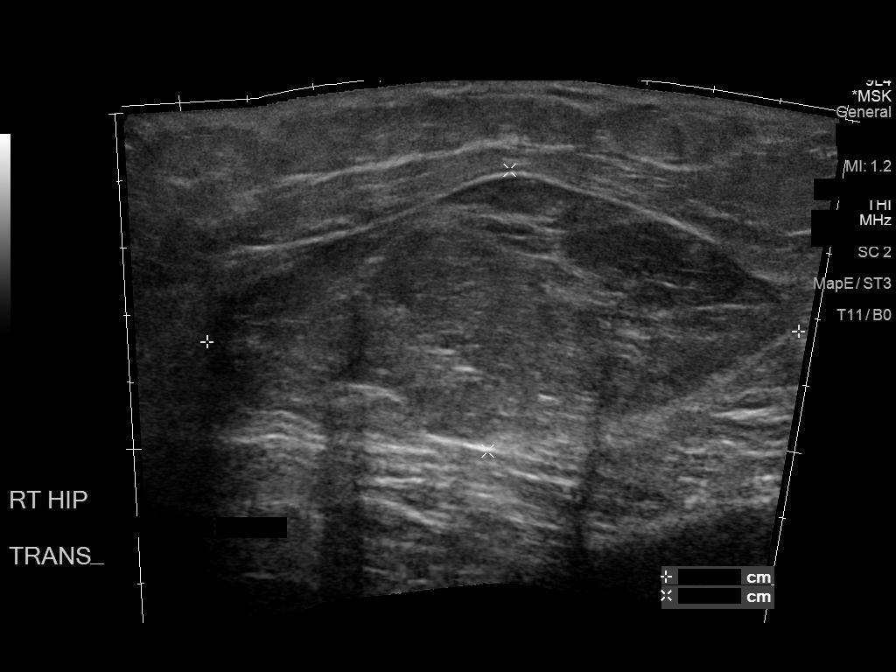
[im 3/9]
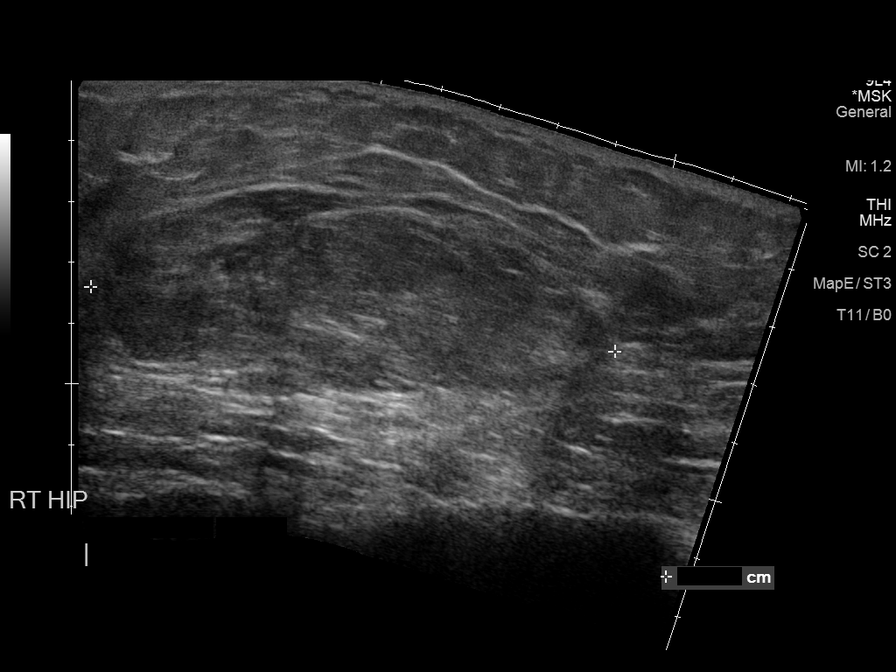
[im 4/9]
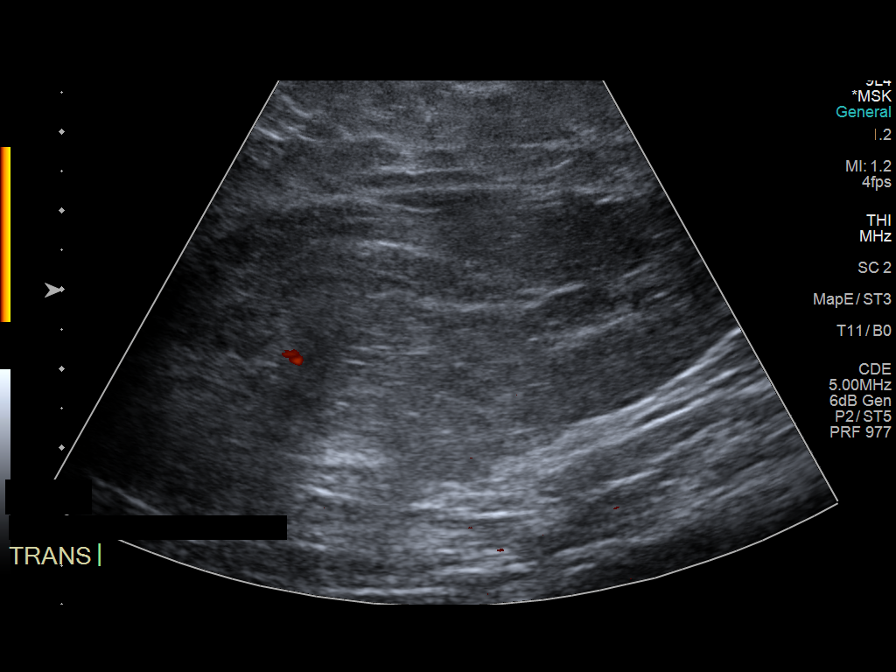
[im 5/9]
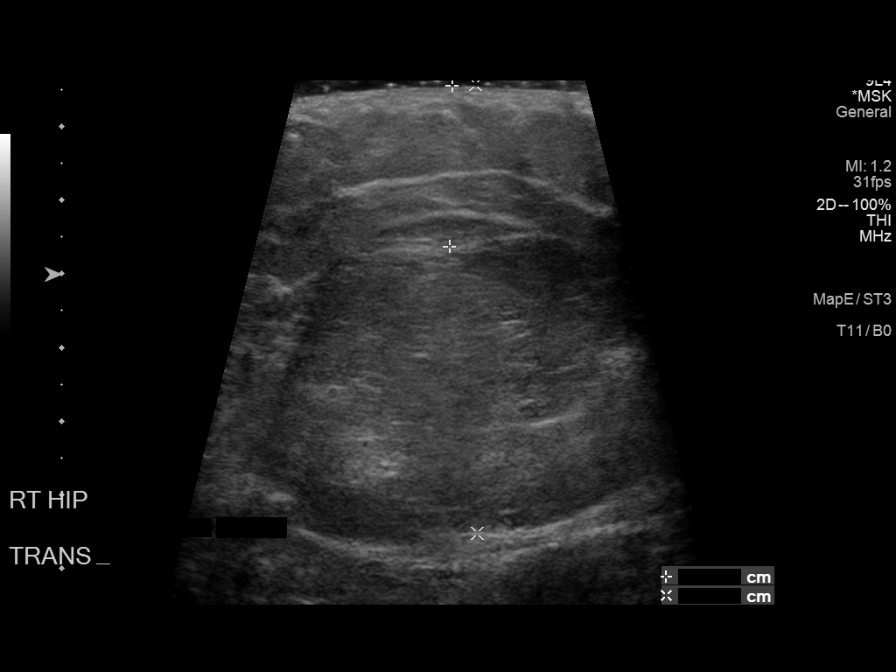
[im 6/9]
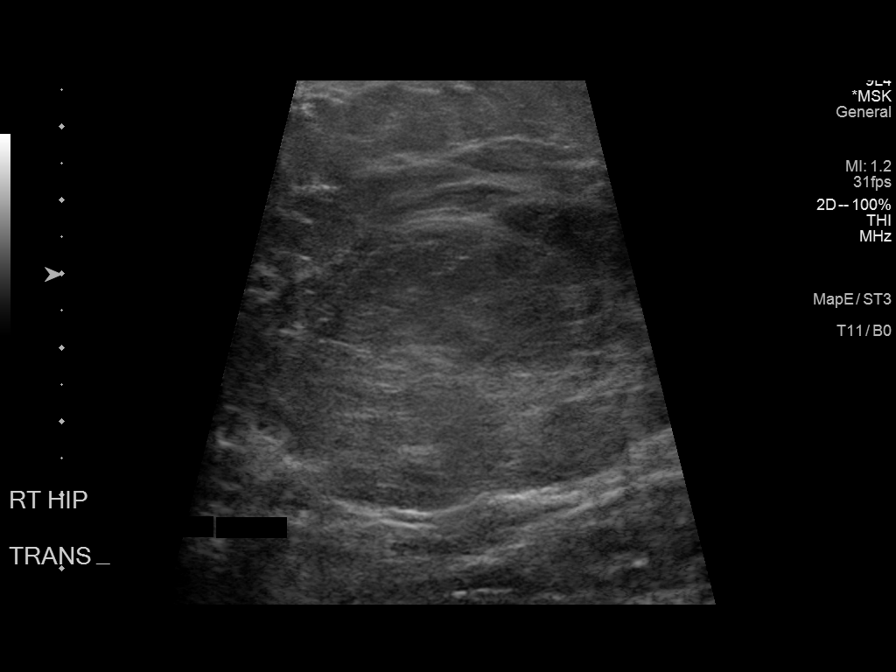
[im 7/9]
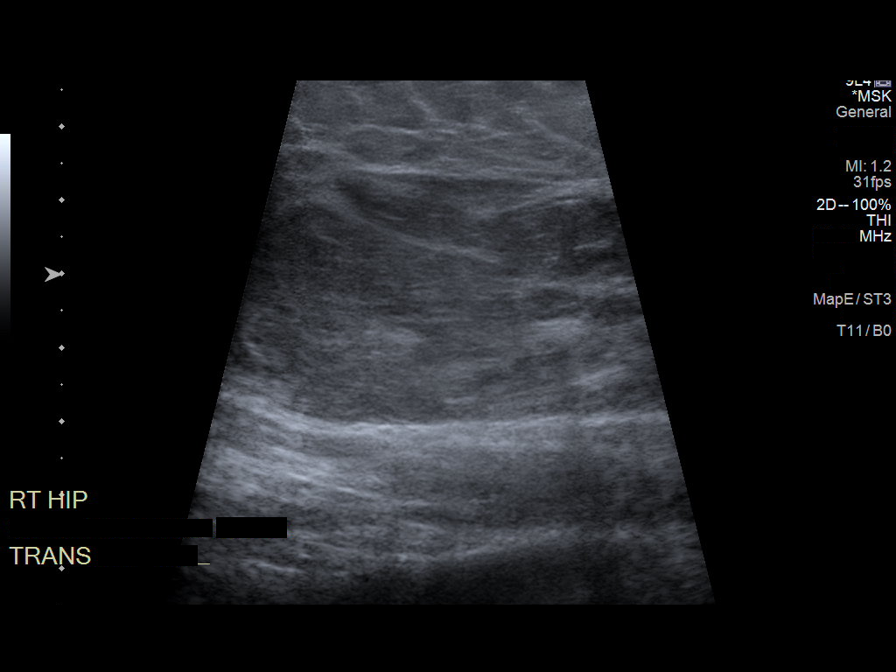
[im 8/9]
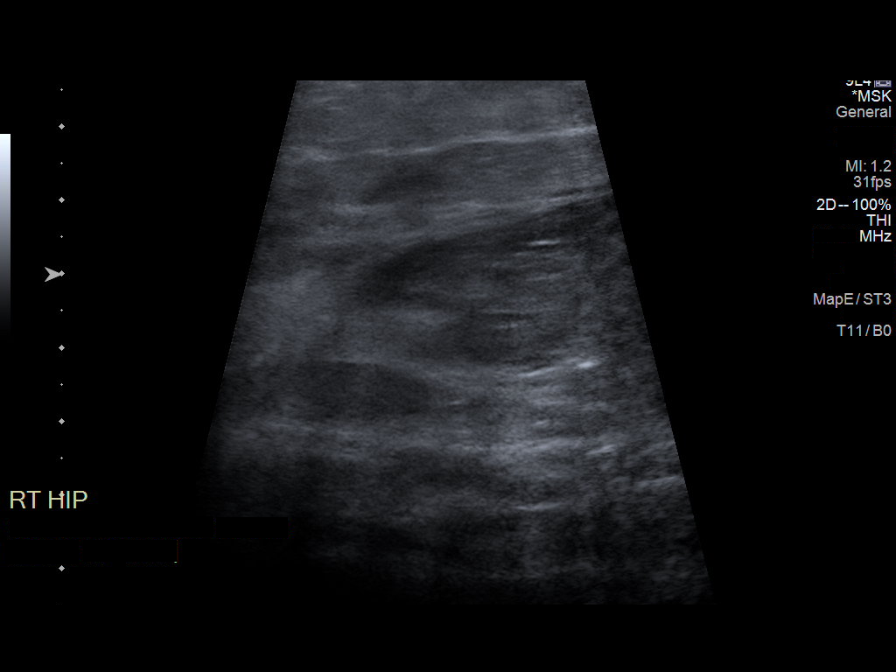
[im 9/9]
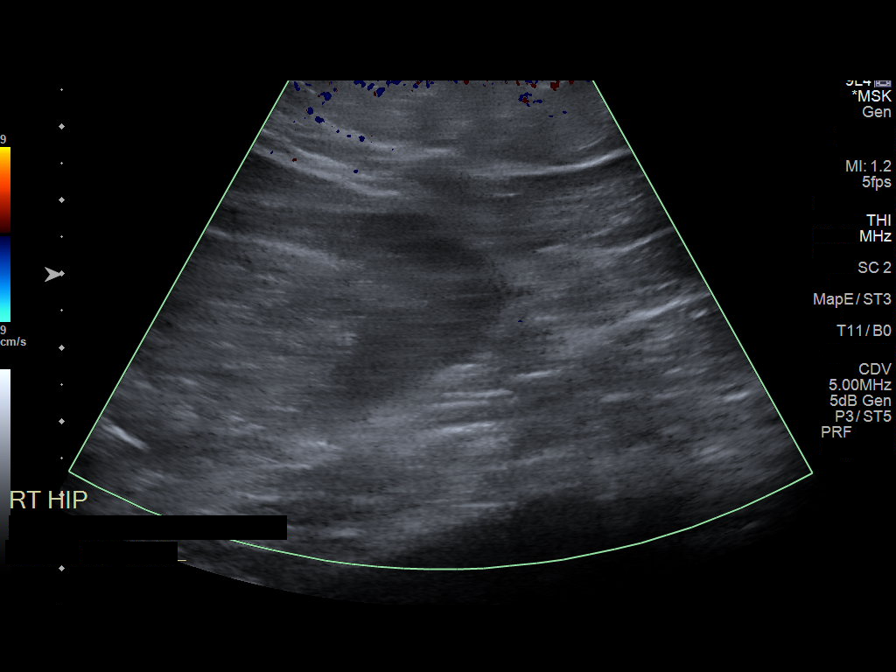

[9 of 9 positions shown; findings below may reference images not displayed]

FINDINGS: Focused ultrasound of the right lateral hip demonstrates a
well-defined 8.6 x 8.7 x 4.2 cm subcutaneous mass isoechoic to fat
with minimal internal vascularity.
IMPRESSION: 1. 8.7 cm subcutaneous lipoma in the right lateral hip.

## 2023-03-11 NOTE — Progress Notes (Signed)
Ct ch 

## 2023-03-15 ENCOUNTER — Ambulatory Visit: Payer: BC Managed Care – PPO | Admitting: Internal Medicine

## 2023-03-16 ENCOUNTER — Other Ambulatory Visit (HOSPITAL_BASED_OUTPATIENT_CLINIC_OR_DEPARTMENT_OTHER): Payer: Self-pay

## 2023-03-19 ENCOUNTER — Other Ambulatory Visit (HOSPITAL_BASED_OUTPATIENT_CLINIC_OR_DEPARTMENT_OTHER): Payer: Self-pay

## 2023-03-20 ENCOUNTER — Ambulatory Visit
Admission: RE | Admit: 2023-03-20 | Discharge: 2023-03-20 | Disposition: A | Discharge status: Home / Self Care | Payer: BC Managed Care – PPO | Source: Ambulatory Visit | Attending: Internal Medicine | Admitting: Internal Medicine

## 2023-03-20 DIAGNOSIS — R911 Solitary pulmonary nodule: Secondary | ICD-10-CM

## 2023-03-26 ENCOUNTER — Other Ambulatory Visit: Payer: Self-pay

## 2023-03-26 ENCOUNTER — Other Ambulatory Visit (HOSPITAL_BASED_OUTPATIENT_CLINIC_OR_DEPARTMENT_OTHER): Payer: Self-pay

## 2023-03-27 ENCOUNTER — Other Ambulatory Visit (HOSPITAL_BASED_OUTPATIENT_CLINIC_OR_DEPARTMENT_OTHER): Payer: Self-pay

## 2023-03-27 ENCOUNTER — Encounter (HOSPITAL_BASED_OUTPATIENT_CLINIC_OR_DEPARTMENT_OTHER): Payer: Self-pay | Admitting: Cardiovascular Disease

## 2023-03-27 ENCOUNTER — Encounter: Payer: Self-pay | Admitting: Pharmacist Clinician (PhC)/ Clinical Pharmacy Specialist

## 2023-03-27 NOTE — Telephone Encounter (Signed)
Dr. Duke Salvia please review recent CT and advise patient questions   PharmD- is patient candidate for [facility] coverage with cardiac indications?

## 2023-04-06 NOTE — Telephone Encounter (Signed)
Please advise 

## 2023-04-20 ENCOUNTER — Other Ambulatory Visit (HOSPITAL_BASED_OUTPATIENT_CLINIC_OR_DEPARTMENT_OTHER): Payer: Self-pay

## 2023-04-23 ENCOUNTER — Telehealth: Payer: Self-pay

## 2023-04-23 NOTE — Telephone Encounter (Signed)
Patient notified us that she has been going to the [facility] clinic. She has gone twice and has received 2 hyalgan injections in her knees. She reports she has 3 more injections to go. YL,RMA

## 2023-05-18 ENCOUNTER — Other Ambulatory Visit (HOSPITAL_BASED_OUTPATIENT_CLINIC_OR_DEPARTMENT_OTHER): Payer: Self-pay

## 2023-05-22 ENCOUNTER — Other Ambulatory Visit (HOSPITAL_BASED_OUTPATIENT_CLINIC_OR_DEPARTMENT_OTHER): Payer: Self-pay

## 2023-05-23 ENCOUNTER — Other Ambulatory Visit (HOSPITAL_BASED_OUTPATIENT_CLINIC_OR_DEPARTMENT_OTHER): Payer: Self-pay

## 2023-06-07 ENCOUNTER — Other Ambulatory Visit (HOSPITAL_BASED_OUTPATIENT_CLINIC_OR_DEPARTMENT_OTHER): Payer: Self-pay

## 2023-06-15 ENCOUNTER — Other Ambulatory Visit (HOSPITAL_BASED_OUTPATIENT_CLINIC_OR_DEPARTMENT_OTHER): Payer: Self-pay

## 2023-06-18 ENCOUNTER — Other Ambulatory Visit (HOSPITAL_BASED_OUTPATIENT_CLINIC_OR_DEPARTMENT_OTHER): Payer: Self-pay

## 2023-07-25 LAB — HM MAMMOGRAPHY

## 2023-07-27 LAB — HM PAP SMEAR

## 2023-07-31 ENCOUNTER — Encounter: Payer: Self-pay | Admitting: Internal Medicine

## 2023-07-31 ENCOUNTER — Ambulatory Visit (INDEPENDENT_AMBULATORY_CARE_PROVIDER_SITE_OTHER): Payer: BC Managed Care – PPO | Admitting: Internal Medicine

## 2023-07-31 VITALS — BP 118/78 | HR 58 | Temp 97.8°F | Ht 65.0 in | Wt 197.6 lb

## 2023-07-31 DIAGNOSIS — E6609 Other obesity due to excess calories: Secondary | ICD-10-CM

## 2023-07-31 DIAGNOSIS — E7800 Pure hypercholesterolemia, unspecified: Secondary | ICD-10-CM

## 2023-07-31 DIAGNOSIS — M25512 Pain in left shoulder: Secondary | ICD-10-CM

## 2023-07-31 DIAGNOSIS — L819 Disorder of pigmentation, unspecified: Secondary | ICD-10-CM

## 2023-07-31 DIAGNOSIS — Z6832 Body mass index (BMI) 32.0-32.9, adult: Secondary | ICD-10-CM

## 2023-07-31 DIAGNOSIS — E66811 Obesity, class 1: Secondary | ICD-10-CM

## 2023-07-31 DIAGNOSIS — Z0000 Encounter for general adult medical examination without abnormal findings: Secondary | ICD-10-CM
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
## 2023-07-31 NOTE — Patient Instructions (Addendum)
Women's Multi greater than 50 Rebounder  Voltaren gel - apply to left shoulder as needed.  [place]  Health Maintenance, Female Adopting a healthy lifestyle and getting preventive care are important in promoting health and wellness. Ask your health care provider about: The right schedule for you to have regular tests and exams. Things you can do on your own to prevent diseases and keep yourself healthy. What should I know about diet, weight, and exercise? Eat a healthy diet  Eat a diet that includes plenty of vegetables, fruits, low-fat dairy products, and lean protein. Do not eat a lot of foods that are high in solid fats, added sugars, or sodium. Maintain a healthy weight Body mass index (BMI) is used to identify weight problems. It estimates body fat based on height and weight. Your health care provider can help determine your BMI and help you achieve or maintain a healthy weight. Get regular exercise Get regular exercise. This is one of the most important things you can do for your health. Most adults should: Exercise for at least 150 minutes each week. The exercise should increase your heart rate and make you sweat (moderate-intensity exercise). Do strengthening exercises at least twice a week. This is in addition to the moderate-intensity exercise. Spend less time sitting. Even light physical activity can be beneficial. Watch cholesterol and blood lipids Have your blood tested for lipids and cholesterol at 62 years of age, then have this test every 5 years. Have your cholesterol levels checked more often if: Your lipid or cholesterol levels are high. You are older than 62 years of age. You are at high risk for heart disease. What should I know about cancer screening? Depending on your health history and family history, you may need to have cancer screening at various ages. This may include screening for: Breast cancer. Cervical cancer. Colorectal cancer. Skin cancer. Lung  cancer. What should I know about heart disease, diabetes, and high blood pressure? Blood pressure and heart disease High blood pressure causes heart disease and increases the risk of stroke. This is more likely to develop in people who have high blood pressure readings or are overweight. Have your blood pressure checked: Every 3-5 years if you are 67-65 years of age. Every year if you are 75 years old or older. Diabetes Have regular diabetes screenings. This checks your fasting blood sugar level. Have the screening done: Once every three years after age 104 if you are at a normal weight and have a low risk for diabetes. More often and at a younger age if you are overweight or have a high risk for diabetes. What should I know about preventing infection? Hepatitis B If you have a higher risk for hepatitis B, you should be screened for this virus. Talk with your health care provider to find out if you are at risk for hepatitis B infection. Hepatitis C Testing is recommended for: Everyone born from [birth year] through 1965. Anyone with known risk factors for hepatitis C. Sexually transmitted infections (STIs) Get screened for STIs, including gonorrhea and chlamydia, if: You are sexually active and are younger than 62 years of age. You are older than 62 years of age and your health care provider tells you that you are at risk for this type of infection. Your sexual activity has changed since you were last screened, and you are at increased risk for chlamydia or gonorrhea. Ask your health care provider if you are at risk. Ask your health care provider about whether you  are at high risk for HIV. Your health care provider may recommend a prescription medicine to help prevent HIV infection. If you choose to take medicine to prevent HIV, you should first get tested for HIV. You should then be tested every 3 months for as long as you are taking the medicine. Pregnancy If you are about to stop having your  period (premenopausal) and you may become pregnant, seek counseling before you get pregnant. Take 400 to 800 micrograms (mcg) of folic acid every day if you become pregnant. Ask for birth control (contraception) if you want to prevent pregnancy. Osteoporosis and menopause Osteoporosis is a disease in which the bones lose minerals and strength with aging. This can result in bone fractures. If you are 31 years old or older, or if you are at risk for osteoporosis and fractures, ask your health care provider if you should: Be screened for bone loss. Take a calcium or vitamin D supplement to lower your risk of fractures. Be given hormone replacement therapy (HRT) to treat symptoms of menopause. Follow these instructions at home: Alcohol use Do not drink alcohol if: Your health care provider tells you not to drink. You are pregnant, may be pregnant, or are planning to become pregnant. If you drink alcohol: Limit how much you have to: 0-1 drink a day. Know how much alcohol is in your drink. In the [place] one drink equals one 12 oz bottle of beer (355 mL), one 5 oz glass of wine (148 mL), or one 1 oz glass of hard liquor (44 mL). Lifestyle Do not use any products that contain nicotine or tobacco. These products include cigarettes, chewing tobacco, and vaping devices, such as e-cigarettes. If you need help quitting, ask your health care provider. Do not use street drugs. Do not share needles. Ask your health care provider for help if you need support or information about quitting drugs. General instructions Schedule regular health, dental, and eye exams. Stay current with your vaccines. Tell your health care provider if: You often feel depressed. You have ever been abused or do not feel safe at home. Summary Adopting a healthy lifestyle and getting preventive care are important in promoting health and wellness. Follow your health care provider's instructions about healthy diet, exercising, and  getting tested or screened for diseases. Follow your health care provider's instructions on monitoring your cholesterol and blood pressure. This information is not intended to replace advice given to you by your health care provider. Make sure you discuss any questions you have with your health care provider. Document Revised: 01/31/2021 Document Reviewed: 01/31/2021 Elsevier Patient Education  2024 [employer].

## 2023-08-01 LAB — LIPID PANEL
Chol/HDL Ratio: 2.7 ratio (ref 0.0–4.4)
Cholesterol, Total: 140 mg/dL (ref 100–199)
HDL: 51 mg/dL (ref >39)
LDL Chol Calc (NIH): 77 mg/dL (ref 0–99)
Triglycerides: 59 mg/dL (ref 0–149)
VLDL Cholesterol Cal: 12 mg/dL (ref 5–40)

## 2023-08-01 LAB — CMP14+EGFR
ALT: 15 [IU]/L (ref 0–32)
AST: 23 [IU]/L (ref 0–40)
Albumin: 4.2 g/dL (ref 3.9–4.9)
Alkaline Phosphatase: 78 [IU]/L (ref 44–121)
BUN/Creatinine Ratio: 16 (ref 12–28)
BUN: 11 mg/dL (ref 8–27)
Bilirubin Total: 0.5 mg/dL (ref 0.0–1.2)
CO2: 25 mmol/L (ref 20–29)
Calcium: 9.7 mg/dL (ref 8.7–10.3)
Chloride: 104 mmol/L (ref 96–106)
Creatinine, Ser: 0.69 mg/dL (ref 0.57–1.00)
Globulin, Total: 2.8 g/dL (ref 1.5–4.5)
Glucose: 75 mg/dL (ref 70–99)
Potassium: 4 mmol/L (ref 3.5–5.2)
Sodium: 141 mmol/L (ref 134–144)
Total Protein: 7 g/dL (ref 6.0–8.5)
eGFR: 98 mL/min/{1.73_m2} (ref >59)

## 2023-08-01 LAB — CBC
Hematocrit: 40.5 % (ref 34.0–46.6)
Hemoglobin: 13 g/dL (ref 11.1–15.9)
MCH: 28.4 pg (ref 26.6–33.0)
MCHC: 32.1 g/dL (ref 31.5–35.7)
MCV: 89 fL (ref 79–97)
Platelets: 252 10*3/uL (ref 150–450)
RBC: 4.57 x10E6/uL (ref 3.77–5.28)
RDW: 12.9 % (ref 11.7–15.4)
WBC: 6.4 10*3/uL (ref 3.4–10.8)

## 2023-08-01 LAB — TSH: TSH: 4.33 u[IU]/mL (ref 0.450–4.500)

## 2023-08-05 ENCOUNTER — Encounter: Payer: Self-pay | Admitting: Internal Medicine

## 2023-08-05 DIAGNOSIS — L819 Disorder of pigmentation, unspecified: Secondary | ICD-10-CM | POA: Insufficient documentation

## 2023-08-05 DIAGNOSIS — M25512 Pain in left shoulder: Secondary | ICD-10-CM | POA: Insufficient documentation
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
## 2023-08-05 NOTE — Assessment & Plan Note (Addendum)
Chronic, currently on rosuvastatin 20mg  daily due to calcium score of 5. Encouraged to aim for at least 150 minutes of exercise per week.

## 2023-08-05 NOTE — Assessment & Plan Note (Signed)

## 2023-08-05 NOTE — Assessment & Plan Note (Signed)
Her sx started about two months ago. She has noted improved range of motion. If persistent, will consider Ortho evaluation.

## 2023-08-05 NOTE — Assessment & Plan Note (Signed)
She is encouraged to strive for BMI less than 30 to decrease cardiac risk. Advised to aim for at least 150 minutes of exercise per week.

## 2023-08-17 ENCOUNTER — Other Ambulatory Visit (HOSPITAL_BASED_OUTPATIENT_CLINIC_OR_DEPARTMENT_OTHER): Payer: Self-pay | Admitting: Cardiovascular Disease

## 2023-08-17 ENCOUNTER — Other Ambulatory Visit (HOSPITAL_BASED_OUTPATIENT_CLINIC_OR_DEPARTMENT_OTHER): Payer: Self-pay

## 2023-08-17 MED ORDER — ZEPBOUND 15 MG/0.5ML ~~LOC~~ SOAJ
15.0000 mg | SUBCUTANEOUS | 2 refills | Status: DC
  Filled 2023-08-17: qty 2, 28d supply, fill #0
  Filled 2023-09-24: qty 2, 28d supply, fill #1
  Filled 2023-10-24 – 2023-12-13 (×5): qty 2, 28d supply, fill #2

## 2023-08-20 ENCOUNTER — Other Ambulatory Visit (HOSPITAL_BASED_OUTPATIENT_CLINIC_OR_DEPARTMENT_OTHER): Payer: Self-pay

## 2023-08-20 ENCOUNTER — Other Ambulatory Visit: Payer: Self-pay

## 2023-09-22 ENCOUNTER — Encounter (HOSPITAL_BASED_OUTPATIENT_CLINIC_OR_DEPARTMENT_OTHER): Payer: Self-pay

## 2023-09-24 ENCOUNTER — Other Ambulatory Visit (HOSPITAL_BASED_OUTPATIENT_CLINIC_OR_DEPARTMENT_OTHER): Payer: Self-pay

## 2023-09-28 ENCOUNTER — Other Ambulatory Visit (HOSPITAL_COMMUNITY): Payer: Self-pay

## 2023-09-28 ENCOUNTER — Other Ambulatory Visit (HOSPITAL_BASED_OUTPATIENT_CLINIC_OR_DEPARTMENT_OTHER): Payer: Self-pay

## 2023-09-29 ENCOUNTER — Other Ambulatory Visit (HOSPITAL_BASED_OUTPATIENT_CLINIC_OR_DEPARTMENT_OTHER): Payer: Self-pay

## 2023-09-29 ENCOUNTER — Other Ambulatory Visit (HOSPITAL_COMMUNITY): Payer: Self-pay

## 2023-10-24 ENCOUNTER — Other Ambulatory Visit (HOSPITAL_BASED_OUTPATIENT_CLINIC_OR_DEPARTMENT_OTHER): Payer: Self-pay

## 2023-10-26 ENCOUNTER — Other Ambulatory Visit (HOSPITAL_BASED_OUTPATIENT_CLINIC_OR_DEPARTMENT_OTHER): Payer: Self-pay

## 2023-12-10 ENCOUNTER — Other Ambulatory Visit (HOSPITAL_BASED_OUTPATIENT_CLINIC_OR_DEPARTMENT_OTHER): Payer: Self-pay

## 2023-12-13 ENCOUNTER — Other Ambulatory Visit: Payer: Self-pay

## 2023-12-13 ENCOUNTER — Other Ambulatory Visit (HOSPITAL_BASED_OUTPATIENT_CLINIC_OR_DEPARTMENT_OTHER): Payer: Self-pay

## 2023-12-13 ENCOUNTER — Encounter: Payer: Self-pay | Admitting: Internal Medicine

## 2023-12-17 ENCOUNTER — Telehealth: Payer: Self-pay | Admitting: Pharmacist Clinician (PhC)/ Clinical Pharmacy Specialist

## 2023-12-17 NOTE — Telephone Encounter (Signed)
 Please do PA for Zepbound.  She's got new insurance, has been on 15 mg dose for some time.    [facility]  Number [id number]  Pharmacy BIN [phone]   Pharmacy PCN # [id number]  Pharmacy Provider Services [phone]   Thanks, Belenda Cruise

## 2023-12-20 ENCOUNTER — Other Ambulatory Visit (HOSPITAL_BASED_OUTPATIENT_CLINIC_OR_DEPARTMENT_OTHER): Payer: Self-pay

## 2023-12-20 MED ORDER — ZEPBOUND 15 MG/0.5ML ~~LOC~~ SOAJ
15.0000 mg | SUBCUTANEOUS | 3 refills | Status: DC
  Filled 2023-12-20: qty 2, 28d supply, fill #0

## 2023-12-20 MED ORDER — ROSUVASTATIN CALCIUM 20 MG PO TABS: 20.0000 mg | ORAL_TABLET | Freq: Every day | ORAL | 3 refills | Status: DC

## 2023-12-20 NOTE — Addendum Note (Signed)
 Addended by: Marlene Lard on: 12/20/2023 10:44 AM   Modules accepted: Orders

## 2023-12-23 ENCOUNTER — Other Ambulatory Visit: Payer: Self-pay | Admitting: Internal Medicine

## 2024-01-29 ENCOUNTER — Ambulatory Visit (INDEPENDENT_AMBULATORY_CARE_PROVIDER_SITE_OTHER): Payer: BC Managed Care – PPO | Admitting: Internal Medicine

## 2024-01-29 ENCOUNTER — Encounter: Payer: Self-pay | Admitting: Internal Medicine

## 2024-01-29 ENCOUNTER — Other Ambulatory Visit (HOSPITAL_BASED_OUTPATIENT_CLINIC_OR_DEPARTMENT_OTHER): Payer: Self-pay

## 2024-01-29 VITALS — BP 122/70 | HR 64 | Temp 98.2°F | Ht 65.0 in | Wt 198.2 lb

## 2024-01-29 DIAGNOSIS — M170 Bilateral primary osteoarthritis of knee: Secondary | ICD-10-CM | POA: Diagnosis not present

## 2024-01-29 DIAGNOSIS — E6609 Other obesity due to excess calories: Secondary | ICD-10-CM | POA: Diagnosis not present

## 2024-01-29 DIAGNOSIS — E66811 Obesity, class 1: Secondary | ICD-10-CM | POA: Diagnosis not present

## 2024-01-29 DIAGNOSIS — E7800 Pure hypercholesterolemia, unspecified: Secondary | ICD-10-CM | POA: Diagnosis not present

## 2024-01-29 DIAGNOSIS — Z6832 Body mass index (BMI) 32.0-32.9, adult: Secondary | ICD-10-CM

## 2024-01-29 MED ORDER — ROSUVASTATIN CALCIUM 20 MG PO TABS: 20.0000 mg | ORAL_TABLET | Freq: Every day | ORAL | 2 refills | Status: AC

## 2024-01-29 MED ORDER — ZEPBOUND 15 MG/0.5ML ~~LOC~~ SOAJ
15.0000 mg | SUBCUTANEOUS | 3 refills | Status: DC
  Filled 2024-01-29: qty 2, 28d supply, fill #0

## 2024-01-29 NOTE — Assessment & Plan Note (Addendum)
 Chronic, I will refer her to Ortho as requested. She has been to [facility] on [address] in the past. She thinks she needs more injections. Pain has returned, possibly due to previous treatment effects wearing off. She prefers care from an orthopedic specialist. - Refer to orthopedic specialist, Dr. Christiane Cowing, for further evaluation and potential treatment. - Request updated imaging studies as needed. - Facilitate transfer of medical records to the orthopedic specialist.

## 2024-01-29 NOTE — Patient Instructions (Signed)
 Cholesterol Content in Foods Cholesterol is a waxy, fat-like substance that helps to carry fat in the blood. The body needs cholesterol in small amounts, but too much cholesterol can cause damage to the arteries and heart. What foods have cholesterol?  Cholesterol is found in animal-based foods, such as meat, seafood, and dairy. Generally, low-fat dairy and lean meats have less cholesterol than full-fat dairy and fatty meats. The milligrams of cholesterol per serving (mg per serving) of common cholesterol-containing foods are listed below. Meats and other proteins Egg -- one large whole egg has 186 mg. Veal shank -- 4 oz (113 g) has 141 mg. Lean ground [place] (93% lean) -- 4 oz (113 g) has 118 mg. Fat-trimmed lamb loin -- 4 oz (113 g) has 106 mg. Lean ground beef (90% lean) -- 4 oz (113 g) has 100 mg. Lobster -- 3.5 oz (99 g) has 90 mg. Pork loin chops -- 4 oz (113 g) has 86 mg. Canned salmon -- 3.5 oz (99 g) has 83 mg. Fat-trimmed beef top loin -- 4 oz (113 g) has 78 mg. Frankfurter -- 1 frank (3.5 oz or 99 g) has 77 mg. Crab -- 3.5 oz (99 g) has 71 mg. Roasted chicken without skin, white meat -- 4 oz (113 g) has 66 mg. Light bologna -- 2 oz (57 g) has 45 mg. Deli-cut [place] -- 2 oz (57 g) has 31 mg. Canned tuna -- 3.5 oz (99 g) has 31 mg. Tomasa Blase -- 1 oz (28 g) has 29 mg. Oysters and mussels (raw) -- 3.5 oz (99 g) has 25 mg. Mackerel -- 1 oz (28 g) has 22 mg. Trout -- 1 oz (28 g) has 20 mg. Pork sausage -- 1 link (1 oz or 28 g) has 17 mg. Salmon -- 1 oz (28 g) has 16 mg. Tilapia -- 1 oz (28 g) has 14 mg. Dairy Soft-serve ice cream --  cup (4 oz or 86 g) has 103 mg. Whole-milk yogurt -- 1 cup (8 oz or 245 g) has 29 mg. Cheddar cheese -- 1 oz (28 g) has 28 mg. American cheese -- 1 oz (28 g) has 28 mg. Whole milk -- 1 cup (8 oz or 250 mL) has 23 mg. 2% milk -- 1 cup (8 oz or 250 mL) has 18 mg. Cream cheese -- 1 tablespoon (Tbsp) (14.5 g) has 15 mg. Cottage cheese --  cup (4 oz or  113 g) has 14 mg. Low-fat (1%) milk -- 1 cup (8 oz or 250 mL) has 10 mg. Sour cream -- 1 Tbsp (12 g) has 8.5 mg. Low-fat yogurt -- 1 cup (8 oz or 245 g) has 8 mg. Nonfat Greek yogurt -- 1 cup (8 oz or 228 g) has 7 mg. Half-and-half cream -- 1 Tbsp (15 mL) has 5 mg. Fats and oils Cod liver oil -- 1 tablespoon (Tbsp) (13.6 g) has 82 mg. Butter -- 1 Tbsp (14 g) has 15 mg. Lard -- 1 Tbsp (12.8 g) has 14 mg. Bacon grease -- 1 Tbsp (12.9 g) has 14 mg. Mayonnaise -- 1 Tbsp (13.8 g) has 5-10 mg. Margarine -- 1 Tbsp (14 g) has 3-10 mg. The items listed above may not be a complete list of foods with cholesterol. Exact amounts of cholesterol in these foods may vary depending on specific ingredients and brands. Contact a dietitian for more information. What foods do not have cholesterol? Most plant-based foods do not have cholesterol unless you combine them with a food that has  cholesterol. Foods without cholesterol include: Grains and cereals. Vegetables. Fruits. Vegetable oils, such as olive, canola, and sunflower oil. Legumes, such as peas, beans, and lentils. Nuts and seeds. Egg whites. The items listed above may not be a complete list of foods that do not have cholesterol. Contact a dietitian for more information. Summary The body needs cholesterol in small amounts, but too much cholesterol can cause damage to the arteries and heart. Cholesterol is found in animal-based foods, such as meat, seafood, and dairy. Generally, low-fat dairy and lean meats have less cholesterol than full-fat dairy and fatty meats. This information is not intended to replace advice given to you by your health care provider. Make sure you discuss any questions you have with your health care provider. Document Revised: 01/21/2021 Document Reviewed: 01/21/2021 Elsevier Patient Education  2024 [employer].

## 2024-01-29 NOTE — Progress Notes (Signed)
 I,Victoria T Basil Lim, CMA,acting as a Neurosurgeon for Smiley Dung, MD.,have documented all relevant documentation on the behalf of Smiley Dung, MD,as directed by  Smiley Dung, MD while in the presence of Smiley Dung, MD.  Subjective:  Patient ID: Ashley Burns , female    DOB: March 11, 1961 , 63 y.o.   MRN: [id number]  Chief Complaint  Patient presents with   Hyperlipidemia    Patient presents today for cholesterol follow up. She reports compliance with medications. Denies headache, chest pain & sob. She would like a referral to an orthopedic for knee discomfort. Previously she had gotten injections in her knee, she feels this is wearing off.  She also states going to the beach recently. She noticed bites on her foot.     HPI Discussed the use of AI scribe software for clinical note transcription with the patient, who gave verbal consent to proceed.  History of Present Illness Ashley Burns is a 63 year old female who presents for a cholesterol check.  She has been taking rosuvastatin  20 mg daily, initially inconsistently but now regularly with food to avoid discomfort. She also takes Zepbound  for prediabetes every ten days instead of weekly due to forgetfulness and cost concerns.  She has lost weight from 212 pounds in June of last year to 198 pounds currently, attributing this to increased physical activity. She walks for 30 minutes every other day, aiming for at least four days a week, and achieves 10,000 steps daily using a fitness tracker. Her breathing has improved, with previous heaviness attributed to her weight now resolved.  She has a history of bilateral knee pain, predominantly in the right knee, for which she received injections at a center previously known as [facility], now called [facility]. The injections provided relief, but she is dissatisfied with the administrative aspects of the center and has been unable to obtain her medical records from  them despite multiple attempts. She describes the injections as a gel-like substance that provided cushioning between the bones.    Past Medical History:  Diagnosis Date   Arthritis    CAD in native artery 03/18/2022   Hypertension    with pregnancy only   Morbid obesity (HCC) 10/26/2021   PMB (postmenopausal bleeding)    Prediabetes 10/26/2021   Pure hypercholesterolemia 10/26/2021   RBBB 10/26/2021   on ekg   Shortness of breath 10/26/2021   none in 2 months per pt on 05-19-2022   Wears glasses      Family History  Problem Relation Age of Onset   Cancer Mother        breast   Heart attack Father    Diabetes Father    Hypertension Father    Heart disease Father    Heart attack Brother    Cancer Cousin        breast   Colon cancer Neg Hx    Esophageal cancer Neg Hx    Stomach cancer Neg Hx    Rectal cancer Neg Hx      Current Outpatient Medications:    BIOTIN PO, Take 2,500 mcg by mouth daily. Hair/skin/nails (Patient not taking: Reported on 07/31/2023), Disp: , Rfl:    diclofenac  Sodium (VOLTAREN ) 1 % GEL, Apply 2 g topically 4 (four) times daily. (Patient not taking: Reported on 07/31/2023), Disp: 100 g, Rfl: 2   ELDERBERRY PO, Take by mouth daily. Ran out of (Patient not taking: Reported on 03/02/2023), Disp: , Rfl:  Ferrous Sulfate (IRON PO), Take 325 mg by mouth daily. (Patient not taking: Reported on 07/31/2023), Disp: , Rfl:    Multiple Vitamin (MULTIVITAMIN ADULT PO), Take 1 tablet by mouth daily at 12 noon. (Patient not taking: Reported on 01/29/2024), Disp: , Rfl:    Omega-3 Fatty Acids (FISH OIL PO), Take by mouth. 3 x weekly (Patient not taking: Reported on 07/31/2023), Disp: , Rfl:    rosuvastatin  (CRESTOR ) 20 MG tablet, Take 1 tablet (20 mg total) by mouth daily., Disp: 90 tablet, Rfl: 2   tirzepatide  (ZEPBOUND ) 15 MG/0.5ML Pen, Inject 15 mg into the skin once a week., Disp: 6 mL, Rfl: 3   TURMERIC PO, Take 1 tablet by mouth daily. (Patient not taking: Reported  on 07/31/2023), Disp: , Rfl:    zinc gluconate 50 MG tablet, Take 50 mg by mouth once a week. (Patient not taking: Reported on 07/31/2023), Disp: , Rfl:    Allergies  Allergen Reactions   Shellfish Allergy     itching     Review of Systems  Constitutional: Negative.   Respiratory: Negative.    Cardiovascular: Negative.   Gastrointestinal: Negative.   Musculoskeletal:  Positive for arthralgias.  Neurological: Negative.   Psychiatric/Behavioral: Negative.       Today's Vitals   01/29/24 1134  BP: 122/70  Pulse: 64  Temp: 98.2 F (36.8 C)  SpO2: 98%  Weight: 198 lb 3.2 oz (89.9 kg)  Height: 5\' 5"  (1.651 m)   Body mass index is 32.98 kg/m.  Wt Readings from Last 3 Encounters:  01/29/24 198 lb 3.2 oz (89.9 kg)  07/31/23 197 lb 9.6 oz (89.6 kg)  03/02/23 212 lb 9.6 oz (96.4 kg)     Objective:  Physical Exam Vitals and nursing note reviewed.  Constitutional:      Appearance: Normal appearance. She is obese.  HENT:     Head: Normocephalic and atraumatic.  Eyes:     Extraocular Movements: Extraocular movements intact.  Cardiovascular:     Rate and Rhythm: Normal rate and regular rhythm.     Heart sounds: Normal heart sounds.  Pulmonary:     Effort: Pulmonary effort is normal.     Breath sounds: Normal breath sounds.  Musculoskeletal:     Cervical back: Normal range of motion.  Skin:    General: Skin is warm.  Neurological:     General: No focal deficit present.     Mental Status: She is alert.  Psychiatric:        Mood and Affect: Mood normal.        Behavior: Behavior normal.       Assessment And Plan:  Pure hypercholesterolemia Assessment & Plan: Chronic, currently on rosuvastatin  20mg  daily due to calcium  score of 5. Encouraged to aim for at least 150 minutes of exercise per week.   Orders: -     CMP14+EGFR -     Lipid panel -     Lipoprotein A (LPA)  Primary osteoarthritis of both knees Assessment & Plan: Chronic, I will refer her to Ortho as  requested. She has been to [facility] [address] in the past. She thinks she needs more injections. Pain has returned, possibly due to previous treatment effects wearing off. She prefers care from an orthopedic specialist. - Refer to orthopedic specialist, Dr. Christiane Cowing, for further evaluation and potential treatment. - Request updated imaging studies as needed. - Facilitate transfer of medical records to the orthopedic specialist.  Orders: -  Ambulatory referral to Orthopedic Surgery  Class 1 obesity due to excess calories with serious comorbidity and body mass index (BMI) of 32.0 to 32.9 in adult Assessment & Plan: She will continue with Zepbound  15mg  weekly. Encouraged to incorporate strength training into her workout routine. Also advised to be intentional about her protein intake.  - Renew Zepbound  prescription. - Advise weekly dosing as prescribed. - Discuss strategies to improve adherence.   Other orders -     Rosuvastatin  Calcium ; Take 1 tablet (20 mg total) by mouth daily.  Dispense: 90 tablet; Refill: 2 -     Zepbound ; Inject 15 mg into the skin once a week.  Dispense: 6 mL; Refill: 3    Return for 6 month chol f/u.Aaron Aas  Patient was given opportunity to ask questions. Patient verbalized understanding of the plan and was able to repeat key elements of the plan. All questions were answered to their satisfaction.   I, Smiley Dung, MD, have reviewed all documentation for this visit. The documentation on 01/29/24 for the exam, diagnosis, procedures, and orders are all accurate and complete.   IF YOU HAVE BEEN REFERRED TO A SPECIALIST, IT MAY TAKE 1-2 WEEKS TO SCHEDULE/PROCESS THE REFERRAL. IF YOU HAVE NOT HEARD FROM US /SPECIALIST IN TWO WEEKS, PLEASE GIVE US  A CALL AT [phone].   THE PATIENT IS ENCOURAGED TO PRACTICE SOCIAL DISTANCING DUE TO THE COVID-19 PANDEMIC.

## 2024-01-29 NOTE — Assessment & Plan Note (Signed)
 Chronic, currently on rosuvastatin 20mg  daily due to calcium score of 5. Encouraged to aim for at least 150 minutes of exercise per week.

## 2024-01-29 NOTE — Assessment & Plan Note (Addendum)
 She will continue with Zepbound  15mg  weekly. Encouraged to incorporate strength training into her workout routine. Also advised to be intentional about her protein intake.  - Renew Zepbound  prescription. - Advise weekly dosing as prescribed. - Discuss strategies to improve adherence.

## 2024-01-30 LAB — LIPID PANEL
Chol/HDL Ratio: 2.7 ratio (ref 0.0–4.4)
Cholesterol, Total: 152 mg/dL (ref 100–199)
HDL: 56 mg/dL (ref >39)
LDL Chol Calc (NIH): 85 mg/dL (ref 0–99)
Triglycerides: 49 mg/dL (ref 0–149)
VLDL Cholesterol Cal: 11 mg/dL (ref 5–40)

## 2024-01-30 LAB — CMP14+EGFR
ALT: 22 IU/L (ref 0–32)
AST: 32 IU/L (ref 0–40)
Albumin: 4.5 g/dL (ref 3.9–4.9)
Alkaline Phosphatase: 82 IU/L (ref 44–121)
BUN/Creatinine Ratio: 24 (ref 12–28)
BUN: 17 mg/dL (ref 8–27)
Bilirubin Total: 0.8 mg/dL (ref 0.0–1.2)
CO2: 21 mmol/L (ref 20–29)
Calcium: 10 mg/dL (ref 8.7–10.3)
Chloride: 103 mmol/L (ref 96–106)
Creatinine, Ser: 0.7 mg/dL (ref 0.57–1.00)
Globulin, Total: 2.9 g/dL (ref 1.5–4.5)
Glucose: 68 mg/dL — ABNORMAL LOW (ref 70–99)
Potassium: 4.2 mmol/L (ref 3.5–5.2)
Sodium: 142 mmol/L (ref 134–144)
Total Protein: 7.4 g/dL (ref 6.0–8.5)
eGFR: 98 mL/min/{1.73_m2} (ref >59)

## 2024-01-30 LAB — LIPOPROTEIN A (LPA): Lipoprotein (a): 180.7 nmol/L — ABNORMAL HIGH (ref <75.0)

## 2024-02-01 ENCOUNTER — Other Ambulatory Visit (HOSPITAL_BASED_OUTPATIENT_CLINIC_OR_DEPARTMENT_OTHER): Payer: Self-pay

## 2024-02-01 ENCOUNTER — Other Ambulatory Visit: Payer: Self-pay

## 2024-02-01 ENCOUNTER — Encounter: Payer: Self-pay | Admitting: Internal Medicine

## 2024-02-04 ENCOUNTER — Encounter: Payer: Self-pay | Admitting: Internal Medicine

## 2024-02-04 ENCOUNTER — Encounter: Payer: Self-pay | Admitting: Orthopaedic Surgery

## 2024-02-05 ENCOUNTER — Other Ambulatory Visit (INDEPENDENT_AMBULATORY_CARE_PROVIDER_SITE_OTHER): Payer: Self-pay

## 2024-02-05 ENCOUNTER — Telehealth: Payer: Self-pay

## 2024-02-05 ENCOUNTER — Ambulatory Visit: Payer: Self-pay | Admitting: Orthopaedic Surgery

## 2024-02-05 ENCOUNTER — Encounter: Payer: Self-pay | Admitting: Orthopaedic Surgery

## 2024-02-05 DIAGNOSIS — M170 Bilateral primary osteoarthritis of knee: Secondary | ICD-10-CM | POA: Diagnosis not present

## 2024-02-05 DIAGNOSIS — M25562 Pain in left knee: Secondary | ICD-10-CM

## 2024-02-05 DIAGNOSIS — M25561 Pain in right knee: Secondary | ICD-10-CM

## 2024-02-05 DIAGNOSIS — M1711 Unilateral primary osteoarthritis, right knee: Secondary | ICD-10-CM | POA: Diagnosis not present

## 2024-02-05 DIAGNOSIS — M1712 Unilateral primary osteoarthritis, left knee: Secondary | ICD-10-CM | POA: Insufficient documentation

## 2024-02-05 NOTE — Progress Notes (Signed)
 Office Visit Note   Patient: Ashley Burns           Date of Birth: 04/11/1961           MRN: [id number] Visit Date: 02/05/2024              Requested by: Cleave Curling, MD [address] [place],  [place] [address] PCP: Cleave Curling, MD   Assessment & Plan: Visit Diagnoses:  1. Primary osteoarthritis of right knee   2. Primary osteoarthritis of left knee     Plan: History of Present Illness Ashley Burns is a 63 year old female who presents with knee stiffness. She was referred by Dr. Elnita Hai for evaluation of her knee stiffness.  She has experienced progressively worsening knee stiffness over the past three years, primarily occurring in the morning. The stiffness is associated with minimal pain, and she has not been taking any medication for it due to its mild nature.  She previously received a series of hyaluronic acid gel injections at [facility], which provided relief for about six to eight months. She completed five out of the scheduled six injections.  Physical Exam MUSCULOSKELETAL: Right knee full extension, ROM >110 degrees, collaterals stable, no effusion. Left knee ROM 0 to >110 degrees, collaterals stable, no effusion. Mild crepitus under patella.  Results RADIOLOGY Knee X-ray: Degenerative changes noted in the lateral, medial, and patellofemoral compartments, consistent with osteoarthritis. (02/05/2024)  Assessment and Plan Arthritis of bilateral knees Chronic arthritis in all compartments of both knees, primarily stiffness. Gel injections effective for 6-8 months. Knee replacements not advised for stiffness alone. - [employer] authorization for hyaluronic acid gel injections. - Administer gel injections upon insurance approval, expected in 3-4 weeks.  This patient is diagnosed with osteoarthritis of the knee(s).    Radiographs show evidence of joint space narrowing, osteophytes, subchondral sclerosis and/or subchondral cysts.  This  patient has knee pain which interferes with functional and activities of daily living.    This patient has experienced inadequate response, adverse effects and/or intolerance with conservative treatments such as acetaminophen , NSAIDS, topical creams, physical therapy or regular exercise, knee bracing and/or weight loss.   This patient has experienced inadequate response or has a contraindication to intra articular steroid injections for at least 3 months.   This patient is not scheduled to have a total knee replacement within 6 months of starting treatment with viscosupplementation.   Follow-Up Instructions: No follow-ups on file.   Orders:  Orders Placed This Encounter  Procedures   XR KNEE 3 VIEW LEFT   XR KNEE 3 VIEW RIGHT   No orders of the defined types were placed in this encounter.     Procedures: No procedures performed   Clinical Data: No additional findings.   Subjective: Chief Complaint  Patient presents with   Right Knee - Pain   Left Knee - Pain    HPI  Review of Systems  Constitutional: Negative.   HENT: Negative.    Eyes: Negative.   Respiratory: Negative.    Cardiovascular: Negative.   Endocrine: Negative.   Musculoskeletal: Negative.   Neurological: Negative.   Hematological: Negative.   Psychiatric/Behavioral: Negative.    All other systems reviewed and are negative.    Objective: Vital Signs: LMP 11/06/2012   Physical Exam Vitals and nursing note reviewed.  Constitutional:      Appearance: She is well-developed.  HENT:     Head: Atraumatic.     Nose: Nose normal.  Eyes:  Extraocular Movements: Extraocular movements intact.  Cardiovascular:     Pulses: Normal pulses.  Pulmonary:     Effort: Pulmonary effort is normal.  Abdominal:     Palpations: Abdomen is soft.  Musculoskeletal:     Cervical back: Neck supple.  Skin:    General: Skin is warm.     Capillary Refill: Capillary refill takes less than 2 seconds.   Neurological:     Mental Status: She is alert. Mental status is at baseline.  Psychiatric:        Behavior: Behavior normal.        Thought Content: Thought content normal.        Judgment: Judgment normal.     Ortho Exam  Specialty Comments:  No specialty comments available.  Imaging: XR KNEE 3 VIEW RIGHT Result Date: 02/05/2024 See interpretation of right knee x-rays.  XR KNEE 3 VIEW LEFT Result Date: 02/05/2024 X-rays of bilateral knees show moderate tricompartmental osteoarthritis with osteophytic spurring.  No acute abnormalities.    PMFS History: Patient Active Problem List   Diagnosis Date Noted   Primary osteoarthritis of left knee 02/05/2024   Primary osteoarthritis of both knees 01/29/2024   Acute pain of left shoulder 08/05/2023   Hyperpigmentation 08/05/2023   Encounter for general adult medical examination w/o abnormal findings 07/31/2023   Left lower quadrant abdominal pain 03/11/2023   Change in stool caliber 03/11/2023   Recurrent pain of right knee 03/11/2023   Lung nodule seen on imaging study 03/11/2023   Slow transit constipation 03/11/2023   CAD in native artery 03/18/2022   Shortness of breath 10/26/2021   Pure hypercholesterolemia 10/26/2021   Morbid obesity (HCC) 10/26/2021   Prediabetes 10/26/2021   RBBB 10/26/2021   Bradycardia 10/26/2021   Other abnormal glucose 08/22/2021   Nocturia 08/22/2021   Patellofemoral syndrome of both knees 07/19/2016   Pes planus 07/19/2016   Degenerative arthritis of right knee 07/19/2016   Obesity 07/19/2016   Asymptomatic gallstones 10/29/2012   Past Medical History:  Diagnosis Date   Arthritis    CAD in native artery 03/18/2022   Hypertension    with pregnancy only   Morbid obesity (HCC) 10/26/2021   PMB (postmenopausal bleeding)    Prediabetes 10/26/2021   Pure hypercholesterolemia 10/26/2021   RBBB 10/26/2021   on ekg   Shortness of breath 10/26/2021   none in 2 months per pt on 05-19-2022    Wears glasses     Family History  Problem Relation Age of Onset   Cancer Mother        breast   Heart attack Father    Diabetes Father    Hypertension Father    Heart disease Father    Heart attack Brother    Cancer Cousin        breast   Colon cancer Neg Hx    Esophageal cancer Neg Hx    Stomach cancer Neg Hx    Rectal cancer Neg Hx     Past Surgical History:  Procedure Laterality Date   CESAREAN SECTION     x3   COLONOSCOPY  04/2021   DILATATION & CURETTAGE/HYSTEROSCOPY WITH MYOSURE N/A 05/26/2022   Procedure: DILATATION & CURETTAGE/HYSTEROSCOPY WITH MYOSURE RESECTION OF ENDOMETRIAL POLYP;  Surgeon: Ivery Marking, MD;  Location: [facility];  Service: Gynecology;  Laterality: N/A;   EXCISION MASS LOWER EXTREMETIES Right 05/26/2021   Procedure: EXCISION RIGHT THIGH MASS;  Surgeon: Oza Blumenthal, MD;  Location: [facility] OR;  Service: General;  Laterality: Right;   Social History   Occupational History   Not on file  Tobacco Use   Smoking status: Never   Smokeless tobacco: Never  Vaping Use   Vaping status: Never Used  Substance and Sexual Activity   Alcohol use: Not Currently   Drug use: No   Sexual activity: Not on file

## 2024-02-05 NOTE — Telephone Encounter (Signed)
Please submit for bil knee gel inj °

## 2024-02-06 ENCOUNTER — Encounter (HOSPITAL_BASED_OUTPATIENT_CLINIC_OR_DEPARTMENT_OTHER): Payer: Self-pay | Admitting: Cardiovascular Disease

## 2024-02-12 MED ORDER — ZEPBOUND 15 MG/0.5ML ~~LOC~~ SOAJ: 15.0000 mg | SUBCUTANEOUS | 3 refills | Status: DC

## 2024-02-13 ENCOUNTER — Other Ambulatory Visit (HOSPITAL_COMMUNITY): Payer: Self-pay

## 2024-02-13 ENCOUNTER — Telehealth: Payer: Self-pay | Admitting: Pharmacy Technician

## 2024-02-13 NOTE — Telephone Encounter (Signed)
 Prior auth for zepbound  denied

## 2024-02-13 NOTE — Telephone Encounter (Signed)
 Started a prior auth Key: [id number]. She is [facility] to see if she gets better luck. [facility] said they do not have a way to bypass saying it is not federal. She will call and let me know

## 2024-02-13 NOTE — Telephone Encounter (Signed)
   Patient has been filling at our pharmacy under the coupon   [facility] is having trouble running this on the coupon. 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 We have a note that she has commercial not federal. They are unable to bypass the rejection. Lmom for patient to call back

## 2024-02-13 NOTE — Telephone Encounter (Signed)
 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 I called [facility] and they said they will call the coupon. I called the patient and made her aware.

## 2024-02-14 NOTE — Telephone Encounter (Signed)
 [facility]oke [facility] and they fixed coupon and its ready and patient awarea

## 2024-02-19 NOTE — Telephone Encounter (Signed)
 Patient called back and said she received her medication

## 2024-02-20 NOTE — Telephone Encounter (Signed)
 Talked with patient concerning gel injection.  Advised of TriVisc Submitted for TriVisc, bilateral knee online through [employer].  Patient is aware that she will receive a call to pay for medication.

## 2024-03-18 ENCOUNTER — Telehealth: Payer: Self-pay

## 2024-03-18 NOTE — Telephone Encounter (Signed)
 Called and left a VM for patient to call  [employer] to pay for medication if she is wanting to proceed. Provided number for [employer]  [phone].

## 2024-04-01 ENCOUNTER — Encounter: Payer: Self-pay | Admitting: *Deleted

## 2024-04-01 ENCOUNTER — Telehealth (HOSPITAL_BASED_OUTPATIENT_CLINIC_OR_DEPARTMENT_OTHER): Payer: Self-pay | Admitting: Cardiovascular Disease

## 2024-04-01 DIAGNOSIS — I2510 Atherosclerotic heart disease of native coronary artery without angina pectoris: Secondary | ICD-10-CM

## 2024-04-01 DIAGNOSIS — G8929 Other chronic pain: Secondary | ICD-10-CM

## 2024-04-01 MED ORDER — ZEPBOUND 15 MG/0.5ML ~~LOC~~ SOAJ
15.0000 mg | SUBCUTANEOUS | 0 refills | Status: DC
Start: 2024-04-01 — End: 2024-04-04

## 2024-04-01 NOTE — Telephone Encounter (Signed)
*  STAT* If patient is at the pharmacy, call can be transferred to refill team.   1. Which medications need to be refilled? (please list name of each medication and dose if known)   tirzepatide  (ZEPBOUND ) 15 MG/0.5ML Pen   2. Would you like to learn more about the convenience, safety, & potential cost savings by using the [facility]?   3. Are you open to using the [facility] (Type [facility]. ).  4. Which pharmacy/location (including street and city if local pharmacy) is medication to be sent to?  [id number] [place], [place] - [address]   5. Do they need a 30 day or 90 day supply?  30 day  Patient stated she is completely out of this medication.  Patient has appointment scheduled at 7/10.

## 2024-04-03 ENCOUNTER — Encounter: Payer: Self-pay | Admitting: Orthopaedic Surgery

## 2024-04-03 ENCOUNTER — Encounter (HOSPITAL_BASED_OUTPATIENT_CLINIC_OR_DEPARTMENT_OTHER): Payer: Self-pay | Admitting: Cardiovascular Disease

## 2024-04-03 ENCOUNTER — Ambulatory Visit (HOSPITAL_BASED_OUTPATIENT_CLINIC_OR_DEPARTMENT_OTHER): Payer: Self-pay | Admitting: Cardiovascular Disease

## 2024-04-03 VITALS — BP 98/58 | HR 60 | Ht 66.0 in | Wt 190.0 lb

## 2024-04-03 DIAGNOSIS — I4510 Unspecified right bundle-branch block: Secondary | ICD-10-CM | POA: Diagnosis not present

## 2024-04-03 DIAGNOSIS — R7303 Prediabetes: Secondary | ICD-10-CM

## 2024-04-03 NOTE — Patient Instructions (Signed)
 Medication Instructions:  Your physician recommends that you continue on your current medications as directed. Please refer to the Current Medication list given to you today.  *If you need a refill on your cardiac medications before your next appointment, please call your pharmacy*  Lab Work: NONE  Testing/Procedures: NONE  Follow-Up: At [facility], you and your health needs are our priority.  As part of our continuing mission to provide you with exceptional heart care, we have created designated Provider Care Teams.  These Care Teams include your primary Cardiologist (physician) and Advanced Practice Providers (APPs -  Physician Assistants and Nurse Practitioners) who all work together to provide you with the care you need, when you need it.  We recommend signing up for the patient portal called MyChart.  Sign up information is provided on this After Visit Summary.  MyChart is used to connect with patients for Virtual Visits (Telemedicine).  Patients are able to view lab/test results, encounter notes, upcoming appointments, etc.  Non-urgent messages can be sent to your provider as well.   To learn more about what you can do with MyChart, go to [email or web address].    Your next appointment:   12 month(s)  The format for your next appointment:   In Person  Provider:   DR RAFORD RECHE ORN NP, AND ROSALINE RAMAN NP

## 2024-04-03 NOTE — Progress Notes (Signed)
 Cardiology Office Note:  .   Date:  04/03/2024  ID:  Ashley Burns, DOB 08-29-1961, MRN [id number] PCP: Jarold Medici, MD  [facility] Providers Cardiologist:  None    History of Present Illness: .   Ashley Burns is a 63 y.o. female with a hx of non-obstructive CAD, hypertensive disorder of pregnancy and RBBB here for follow-up.  She was seen 10/2021 for the evaluation of shortness of breath, palpitations, and family history of heart disease at the request of Dr. Jarold.  She last saw Dr. Jarold 07/2021 for annual physical exam. She complained of shortness of breath at rest and palpitations lasting a few months prior. She also endorsed a family history of heart disease on her father's side. She was referred to cardiology.  Her brother used crutches due to neonatal leg injuries and had various MIs and passed away in his 30s. Her father also had cardiovascular disease.    We discussed focusing on diet and exercise.  We also started her on Ozempic .  Coronary calcium  score 11/2021 revealed a score of 5, which was 75th percentile for age and gender.  She was started on rosuvastatin . At her visit 07/2022 she was feeling well and losing weight.  At her visit 01/2023 she was doing well and was working to increase her exercise.  She was switched from Wegovy  to Zepbound .   Discussed the use of AI scribe software for clinical note transcription with the patient, who gave verbal consent to proceed.  History of Present Illness Ashley Burns has experienced significant improvement in her symptoms since her last visit. Initially, she had shortness of breath, knee pain, back pain, and chest pains, all of which have resolved.  She has lost a substantial amount of weight, going from 244 pounds to 190 pounds, and feels like she has 'got her life back.' She is concerned about potential side effects from her medications, specifically rosuvastatin  and Zepbound , but reports no significant issues aside from  occasional constipation.  Her current exercise routine includes walking 30 minutes every morning and achieving a daily goal of 10,000 steps. She feels well during these activities and has increased her walking to three miles a day.  She notes occasional swelling in her ankles when consuming salty foods but denies any persistent swelling in her legs or feet. No lightheadedness or dizziness despite a recent low blood pressure reading.  She is currently taking rosuvastatin  and Zepbound , and there was a recent issue with the pharmacy regarding the refill of Zepbound , which she is addressing.  She is actively involved in her son's activities, who is on the autism spectrum and is preparing for his senior year, participating in swimming and band. Her son is considering attending [employer], which offers a program for individuals on the spectrum.   ROS:  As per HPI  Studies Reviewed: SABRA   EKG Interpretation Date/Time:  Thursday April 03 2024 09:14:59 EDT Ventricular Rate:  60 PR Interval:  162 QRS Duration:  132 QT Interval:  406 QTC Calculation: 406 R Axis:   -31  Text Interpretation: Normal sinus rhythm Left axis deviation Right bundle branch block When compared with ECG of 26-May-2021 14:50, Criteria for Septal infarct are no longer Present T wave inversion no longer evident in Anterior leads Confirmed by Raford Riggs ([id number]) on 04/03/2024 9:22:31 AM     Risk Assessment/Calculations:             Physical Exam:   VS:  BP (!) 98/58 (BP  Location: Left Arm, Patient Position: Sitting, Cuff Size: Large)   Pulse 60   Ht 5' 6 (1.676 m)   Wt 190 lb (86.2 kg)   LMP 11/06/2012   SpO2 98%   BMI 30.67 kg/m  , BMI Body mass index is 30.67 kg/m. GENERAL:  Well appearing HEENT: Pupils equal round and reactive, fundi not visualized, oral mucosa unremarkable NECK:  No jugular venous distention, waveform within normal limits, carotid upstroke brisk and symmetric, no bruits, no thyromegaly LUNGS:   Clear to auscultation bilaterally HEART:  RRR.  PMI not displaced or sustained,S1 and S2 within normal limits, no S3, no S4, no clicks, no rubs, no murmurs ABD:  Flat, positive bowel sounds normal in frequency in pitch, no bruits, no rebound, no guarding, no midline pulsatile mass, no hepatomegaly, no splenomegaly EXT:  2 plus pulses throughout, no edema, no cyanosis no clubbing SKIN:  No rashes no nodules NEURO:  Cranial nerves II through XII grossly intact, motor grossly intact throughout PSYCH:  Cognitively intact, oriented to person place and time   ASSESSMENT AND PLAN: .    Assessment & Plan # Obesity Significant weight loss and symptom improvement achieved through lifestyle modifications and Zepbound .  BMI now in the overweight range.   Discussed potential side effects and pharmacy concerns. - Ensure a year's supply of Zepbound  at the pharmacy. - Continue current exercise and dietary regimen. - Discuss potential titration to a maintenance dose of Zepbound  once closer to normal weight range.  # Hyperlipidemia Managed with rosuvastatin , well-tolerated. Cholesterol levels are well-controlled. Discussed potential side effects. - Continue rosuvastatin  as prescribed.   Dispo: f/u 1 year  Signed, Annabella Scarce, MD

## 2024-04-04 ENCOUNTER — Encounter (HOSPITAL_BASED_OUTPATIENT_CLINIC_OR_DEPARTMENT_OTHER): Payer: Self-pay | Admitting: Cardiovascular Disease

## 2024-04-04 DIAGNOSIS — I2510 Atherosclerotic heart disease of native coronary artery without angina pectoris: Secondary | ICD-10-CM

## 2024-04-04 MED ORDER — ZEPBOUND 15 MG/0.5ML ~~LOC~~ SOLN: 0.5000 mL | SUBCUTANEOUS | 1 refills | Status: DC

## 2024-06-04 ENCOUNTER — Ambulatory Visit: Payer: Self-pay

## 2024-06-04 NOTE — Telephone Encounter (Signed)
 FYI Only or Action Required?: FYI only for provider.  Patient was last seen in primary care on 01/29/2024 by Jarold Medici, MD.  Called Nurse Triage reporting Weight Loss and Wound Check.  Symptoms began yesterday.  Interventions attempted: OTC medications: alcohol disinfectant, hydrogen peroxide, neosporin, motrin.  Symptoms are: lower abdominal fold/ former c section scar with redness/inflammation/pain/foul odor gradually worsening.  Triage Disposition: See Physician Within 24 Hours  Patient/caregiver understands and will follow disposition?: Yes            Copied from CRM [id number]. Topic: Clinical - Red Word Triage >> Jun 04, 2024 10:15 AM Willma SAUNDERS wrote: [facility] that prompted transfer to Nurse Triage: Patient states she has lost a considerable amount of weight and she is having issues with inflammation under her belly fat where her c-section was/ States it is painful and burns when she puts antiseptic on it. Wants to discuss a referral. Reason for Disposition  [1] Looks infected (e.g., spreading redness, pus) AND [2] no fever  Answer Assessment - Initial Assessment Questions Patient states she has lost about 60 pounds of intentional weight loss and she thinks that weight loss may have changed or cause a pulling on her c section scar.   1. LOCATION: Where is the wound located?      Lower abdomen, former C section scar. She states she has an abdominal fold that hangs over the area.  2. WOUND APPEARANCE: What does the wound look like?      Scar, no open wound. Redness and inflammation. States it looks like a rash. Foul odor.  3. SIZE: If redness is present, ask: What is the size of the red area? (Inches, centimeters, or compare to size of a coin)      Couple of inches.  4. SPREAD: What's changed in the last day?  Do you see any red streaks coming from the wound?     The same today. Yesterday noticed inflammation/redness/warmth and painful/burning when  cleaning.  5. ONSET: When did it start to look infected?      Yesterday.  6. MECHANISM: How did the wound start, what was the cause?     18 years ago, C section scar.  7. PAIN: Do you have any pain?  If Yes, ask: How bad is the pain?  (e.g., Scale 1-10; mild, moderate, or severe)     Yes, burning pain when cleaned with antiseptic. Moderate.  8. FEVER: Do you have a fever? If Yes, ask: What is your temperature, how was it measured, and when did it start?     No.  9. OTHER SYMPTOMS: Do you have any other symptoms? (e.g., shaking chills, weakness, rash elsewhere on body)     Denies chills, rash, weakness, pus drainage.  10. PREGNANCY: Is there any chance you are pregnant? When was your last menstrual period?       N/A.  Protocols used: Wound Infection Suspected-A-AH

## 2024-06-05 ENCOUNTER — Encounter: Payer: Self-pay | Admitting: Family Medicine

## 2024-06-05 ENCOUNTER — Ambulatory Visit: Admitting: Family Medicine

## 2024-06-05 VITALS — BP 124/80 | HR 81 | Wt 191.8 lb

## 2024-06-05 DIAGNOSIS — L304 Erythema intertrigo: Secondary | ICD-10-CM | POA: Diagnosis not present

## 2024-06-05 MED ORDER — NYSTATIN 100000 UNIT/GM EX POWD
1.0000 | Freq: Three times a day (TID) | CUTANEOUS | 1 refills | Status: AC
Start: 2024-06-05 — End: ?

## 2024-06-05 MED ORDER — CEPHALEXIN 500 MG PO CAPS: 500.0000 mg | ORAL_CAPSULE | Freq: Four times a day (QID) | ORAL | 0 refills | Status: AC

## 2024-06-05 NOTE — Progress Notes (Signed)
   Name: Ashley Burns   Date of Visit: 06/05/24   Date of last visit with me: Visit date not found   CHIEF COMPLAINT:  Chief Complaint  Patient presents with   Acute Visit    lower abdomen, c section scar with redness, warmth, pain when disinfecting, inflammation and foul odor since yesterday        HPI:  Discussed the use of AI scribe software for clinical note transcription with the patient, who gave verbal consent to proceed.  History of Present Illness   Ashley Burns is a 63 year old female who presents with inflammation and irritation at the site of a previous C-section. She is accompanied by her daughter, Starr.  She has been experiencing inflammation and irritation at the site of her previous C-section for the past two days. The area is painful and appears to be enlarging. She attempted to clean the area with alcohol, which caused a burning sensation, indicating skin irritation.  She is currently on a weight loss journey, having lost approximately sixty pounds, and aims to lose an additional fifty pounds. The weight loss has contributed to the development of skin folds, which she believes is causing the recurrent irritation.         OBJECTIVE:       07/31/2023    3:29 PM  Depression screen PHQ 2/9  Decreased Interest 0  Down, Depressed, Hopeless 0  PHQ - 2 Score 0  Altered sleeping 1  Tired, decreased energy 1  Change in appetite 0  Feeling bad or failure about yourself  0  Trouble concentrating 0  Moving slowly or fidgety/restless 0  Suicidal thoughts 0  PHQ-9 Score 2  Difficult doing work/chores Not difficult at all     BP Readings from Last 3 Encounters:  06/05/24 124/80  04/03/24 (!) 98/58  01/29/24 122/70    BP 124/80   Pulse 81   Wt 191 lb 12.8 oz (87 kg)   LMP 11/06/2012   SpO2 98%   BMI 30.96 kg/m    Physical Exam          Physical Exam  Inspection reveals some redness and mild scaling of previous c-section scar over loose  skin. Some redness without any significant erythematous extension.  ASSESSMENT/PLAN:   Assessment & Plan Intertrigo    Assessment and Plan    Intertrigo of abdominal skin fold Intertrigo likely due to moisture accumulation post-weight loss, worsened by exercise and alcohol use. - Prescribed nystatin  antifungal powder for application thrice daily for two weeks. - Prescribed keflex  antibiotic for potential bacterial infection. - Instructed to shower post-exercise, wash, and pat dry the area. - Advised against alcohol use on the affected area.  Excess abdominal skin after weight loss Excess skin contributes to recurrent intertrigo. Surgical removal is definitive treatment. - Recommended plastic surgeon consultation for evaluation and potential skin removal post-weight loss.         Araceli Arango A. Vita MD [facility]

## 2024-07-28 ENCOUNTER — Encounter: Payer: Self-pay | Admitting: Radiology

## 2024-07-31 ENCOUNTER — Ambulatory Visit: Payer: Self-pay | Admitting: Internal Medicine

## 2024-08-02 LAB — HM MAMMOGRAPHY

## 2024-08-04 ENCOUNTER — Ambulatory Visit: Payer: Self-pay | Admitting: Internal Medicine

## 2024-08-04 ENCOUNTER — Encounter: Payer: Self-pay | Admitting: Internal Medicine

## 2024-08-04 VITALS — BP 110/70 | HR 62 | Temp 98.3°F | Ht 66.0 in | Wt 174.8 lb

## 2024-08-04 DIAGNOSIS — E663 Overweight: Secondary | ICD-10-CM | POA: Diagnosis not present

## 2024-08-04 DIAGNOSIS — Z23 Encounter for immunization: Secondary | ICD-10-CM

## 2024-08-04 DIAGNOSIS — Z6828 Body mass index (BMI) 28.0-28.9, adult: Secondary | ICD-10-CM | POA: Insufficient documentation

## 2024-08-04 DIAGNOSIS — E7800 Pure hypercholesterolemia, unspecified: Secondary | ICD-10-CM | POA: Diagnosis not present

## 2024-08-04 DIAGNOSIS — Z0000 Encounter for general adult medical examination without abnormal findings: Secondary | ICD-10-CM

## 2024-08-04 DIAGNOSIS — H6123 Impacted cerumen, bilateral: Secondary | ICD-10-CM | POA: Insufficient documentation

## 2024-08-04 NOTE — Progress Notes (Signed)
 I,Ashley Burns, CMA,acting as a neurosurgeon for Ashley LOISE Slocumb, MD.,have documented all relevant documentation on the behalf of Ashley LOISE Slocumb, MD,as directed by  Ashley LOISE Slocumb, MD while in the presence of Ashley LOISE Slocumb, MD.  Subjective:    Patient ID: Ashley Burns , female    DOB: 06-01-1961 , 63 y.o.   MRN: [id number]  Chief Complaint  Patient presents with   Annual Exam    Patient is here for physical exam. Patient receives her GYN from Dr Rutherford. Her last PAP was 07/20/22. She has no specific concerns or complaints at this time Mammogram completed Saturday at [facility].    Hyperlipidemia    HPI Discussed the use of AI scribe software for clinical note transcription with the patient, who gave verbal consent to proceed.  History of Present Illness [name] is a 63 year old female who presents for a full physical exam.  Her A1c in September was 5.4. Liver function tests were normal, and her LDL cholesterol was 104 mg/dL. She occasionally forgets to take her rosuvastatin  20 mg daily but takes it when she remembers.  She has not been taking Zepbound  since April or May due to pharmacy availability and insurance issues. Despite this, she has lost 17 pounds since September. She attributes her weight loss to participating in a program called 'Forty Day Turn Up', which she started in the second week of October. The program involves weekly check-ins with a group and a coach, dietary monitoring, and exercise, including walking and video workouts.  She had a mammogram at [facility] in [place] this past Saturday, but the results are not yet available. She has had repeat mammograms in the past. This was her first time going to [facility], and she provided her information for the results to be sent to her current provider.  She reports regular bowel movements every other day and has been consuming half her body weight in water over the last 40 days. No regular breast self-exams.       Past Medical History:  Diagnosis Date   Arthritis    CAD in native artery 03/18/2022   Hypertension    with pregnancy only   Morbid obesity (HCC) 10/26/2021   PMB (postmenopausal bleeding)    Prediabetes 10/26/2021   Pure hypercholesterolemia 10/26/2021   RBBB 10/26/2021   on ekg   Shortness of breath 10/26/2021   none in 2 months per pt on 05-19-2022   Wears glasses      Family History  Problem Relation Age of Onset   Cancer Mother        breast   Heart attack Father    Diabetes Father    Hypertension Father    Heart disease Father    Heart attack Brother    Cancer Cousin        breast   Colon cancer Neg Hx    Esophageal cancer Neg Hx    Stomach cancer Neg Hx    Rectal cancer Neg Hx      Current Outpatient Medications:    Multiple Vitamin (MULTIVITAMIN ADULT PO), Take 1 tablet by mouth daily at 12 noon., Disp: , Rfl:    nystatin  (MYCOSTATIN /NYSTOP ) powder, Apply 1 Application topically 3 (three) times daily., Disp: 60 g, Rfl: 1   rosuvastatin  (CRESTOR ) 20 MG tablet, Take 1 tablet (20 mg total) by mouth daily., Disp: 90 tablet, Rfl: 2   Allergies  Allergen Reactions   Shellfish Allergy  itching      The patient states she uses none for birth control. Patient's last menstrual period was 11/06/2012.. Negative for Dysmenorrhea. Negative for: breast discharge, breast lump(s), breast pain and breast self exam. Associated symptoms include abnormal vaginal bleeding. Pertinent negatives include abnormal bleeding (hematology), anxiety, decreased libido, depression, difficulty falling sleep, dyspareunia, history of infertility, nocturia, sexual dysfunction, sleep disturbances, urinary incontinence, urinary urgency, vaginal discharge and vaginal itching. Diet regular.The patient states her exercise level is    . The patient's tobacco use is:  Social History   Tobacco Use  Smoking Status Never  Smokeless Tobacco Never  . She has been exposed to passive smoke. The  patient's alcohol use is:  Social History   Substance and Sexual Activity  Alcohol Use Not Currently      Review of Systems  Constitutional: Negative.   HENT: Negative.    Eyes: Negative.   Respiratory: Negative.    Cardiovascular: Negative.   Gastrointestinal: Negative.   Endocrine: Negative.   Genitourinary: Negative.   Musculoskeletal: Negative.   Skin: Negative.   Allergic/Immunologic: Negative.   Neurological: Negative.   Hematological: Negative.   Psychiatric/Behavioral: Negative.       Today's Vitals   08/04/24 1114  BP: 110/70  Pulse: 62  Temp: 98.3 F (36.8 C)  SpO2: 98%  Weight: 174 lb 12.8 oz (79.3 kg)  Height: 5' 6 (1.676 m)   Body mass index is 28.21 kg/m.  Wt Readings from Last 3 Encounters:  08/04/24 174 lb 12.8 oz (79.3 kg)  06/05/24 191 lb 12.8 oz (87 kg)  04/03/24 190 lb (86.2 kg)     Objective:  Physical Exam Vitals and nursing note reviewed.  Constitutional:      Appearance: Normal appearance.  HENT:     Head: Normocephalic and atraumatic.     Right Ear: Ear canal and external ear normal. There is impacted cerumen.     Left Ear: Ear canal and external ear normal. There is impacted cerumen.     Nose: Nose normal.     Mouth/Throat:     Mouth: Mucous membranes are moist.     Pharynx: Oropharynx is clear.  Eyes:     Extraocular Movements: Extraocular movements intact.     Conjunctiva/sclera: Conjunctivae normal.     Pupils: Pupils are equal, round, and reactive to light.  Cardiovascular:     Rate and Rhythm: Normal rate and regular rhythm.     Pulses: Normal pulses.     Heart sounds: Normal heart sounds.  Pulmonary:     Effort: Pulmonary effort is normal.     Breath sounds: Normal breath sounds.  Chest:  Breasts:    Tanner Score is 5.     Right: Normal.     Left: Normal.  Abdominal:     General: Abdomen is flat. Bowel sounds are normal.     Palpations: Abdomen is soft.  Genitourinary:    Comments: deferred Musculoskeletal:         General: Normal range of motion.     Cervical back: Normal range of motion and neck supple.  Skin:    General: Skin is warm and dry.     Comments: Hyperpigmentation left shoulder, no vesicular lesions noted.   Neurological:     General: No focal deficit present.     Mental Status: She is alert and oriented to person, place, and time.  Psychiatric:        Mood and Affect: Mood normal.  Behavior: Behavior normal.         Assessment And Plan:     Encounter for general adult medical examination w/o abnormal findings Assessment & Plan: A full exam was performed.  Importance of monthly self breast exams was discussed with the patient.  She is advised to get 30-45 minutes of regular exercise, no less than four to five days per week. Both weight-bearing and aerobic exercises are recommended.  She is advised to follow a healthy diet with at least six fruits/veggies per day, decrease intake of red meat and other saturated fats and to increase fish intake to twice weekly.  Meats/fish should not be fried -- baked, boiled or broiled is preferable. It is also important to cut back on your sugar intake.  Be sure to read labels - try to avoid anything with added sugar, high fructose corn syrup or other sweeteners.  If you must use a sweetener, you can try stevia or monkfruit.  It is also important to avoid artificially sweetened foods/beverages and diet drinks. Lastly, wear SPF 50 sunscreen on exposed skin and when in direct sunlight for an extended period of time.  Be sure to avoid fast food restaurants and aim for at least 60 ounces of water daily.    - Engaged in weight loss program 40 days Turn UP with 17-pound loss since September. - Await mammogram results from [facility].  Orders: -     CBC -     CMP14+EGFR -     Lipid panel -     Hemoglobin A1c  Pure hypercholesterolemia Assessment & Plan: Chronic, LDL cholesterol at 104 mg/dL. On rosuvastatin  20 mg daily, occasionally misses doses. No  recent Zepbound  use due to pharmacy issues. - Continue rosuvastatin  20 mg daily.   Bilateral impacted cerumen Assessment & Plan: After obtaining verbal consent, both ears were flushed by irrigation. No TM abnormalities were noted. He tolerated procedure well without any complications.      Overweight with body mass index (BMI) of 28 to 28.9 in adult Assessment & Plan: She was congratulated on her recent 17lb weight loss since starting the 40 day Turn UP program.  This is so awesome, all while being off of Zepbound !  She is encouraged to continue to prioritize her protein intake and to engage in strength training on a regular basis.    Immunization due -     Flu vaccine trivalent PF, 6mos and older(Flulaval,Afluria,Fluarix,Fluzone)    Return for 1 year HM , 6 month chol f/u.SABRA Patient was given opportunity to ask questions. Patient verbalized understanding of the plan and was able to repeat key elements of the plan. All questions were answered to their satisfaction.    I, Ashley LOISE Slocumb, MD, have reviewed all documentation for this visit. The documentation on 08/04/24 for the exam, diagnosis, procedures, and orders are all accurate and complete.

## 2024-08-04 NOTE — Assessment & Plan Note (Addendum)
 A full exam was performed.  Importance of monthly self breast exams was discussed with the patient.  She is advised to get 30-45 minutes of regular exercise, no less than four to five days per week. Both weight-bearing and aerobic exercises are recommended.  She is advised to follow a healthy diet with at least six fruits/veggies per day, decrease intake of red meat and other saturated fats and to increase fish intake to twice weekly.  Meats/fish should not be fried -- baked, boiled or broiled is preferable. It is also important to cut back on your sugar intake.  Be sure to read labels - try to avoid anything with added sugar, high fructose corn syrup or other sweeteners.  If you must use a sweetener, you can try stevia or monkfruit.  It is also important to avoid artificially sweetened foods/beverages and diet drinks. Lastly, wear SPF 50 sunscreen on exposed skin and when in direct sunlight for an extended period of time.  Be sure to avoid fast food restaurants and aim for at least 60 ounces of water daily.    - Engaged in weight loss program 40 days Turn UP with 17-pound loss since September. - Await mammogram results from [facility].

## 2024-08-04 NOTE — Assessment & Plan Note (Signed)
 She was congratulated on her recent 17lb weight loss since starting the 40 day Turn UP program.  This is so awesome, all while being off of Zepbound !  She is encouraged to continue to prioritize her protein intake and to engage in strength training on a regular basis.

## 2024-08-04 NOTE — Assessment & Plan Note (Signed)
 Chronic, LDL cholesterol at 104 mg/dL. On rosuvastatin  20 mg daily, occasionally misses doses. No recent Zepbound  use due to pharmacy issues. - Continue rosuvastatin  20 mg daily.

## 2024-08-04 NOTE — Assessment & Plan Note (Signed)
 After obtaining verbal consent, both ears were flushed by irrigation. No TM abnormalities were noted. He tolerated procedure well without any complications.

## 2024-08-04 NOTE — Patient Instructions (Signed)

## 2024-08-05 ENCOUNTER — Ambulatory Visit: Payer: Self-pay | Admitting: Internal Medicine

## 2024-08-05 LAB — CMP14+EGFR
ALT: 25 IU/L (ref 0–32)
AST: 41 IU/L — ABNORMAL HIGH (ref 0–40)
Albumin: 4.6 g/dL (ref 3.9–4.9)
Alkaline Phosphatase: 75 IU/L (ref 49–135)
BUN/Creatinine Ratio: 17 (ref 12–28)
BUN: 13 mg/dL (ref 8–27)
Bilirubin Total: 0.6 mg/dL (ref 0.0–1.2)
CO2: 21 mmol/L (ref 20–29)
Calcium: 10 mg/dL (ref 8.7–10.3)
Chloride: 102 mmol/L (ref 96–106)
Creatinine, Ser: 0.78 mg/dL (ref 0.57–1.00)
Globulin, Total: 2.6 g/dL (ref 1.5–4.5)
Glucose: 81 mg/dL (ref 70–99)
Potassium: 4.3 mmol/L (ref 3.5–5.2)
Sodium: 143 mmol/L (ref 134–144)
Total Protein: 7.2 g/dL (ref 6.0–8.5)
eGFR: 85 mL/min/1.73 (ref >59)

## 2024-08-05 LAB — CBC
Hematocrit: 42 % (ref 34.0–46.6)
Hemoglobin: 13.6 g/dL (ref 11.1–15.9)
MCH: 29.6 pg (ref 26.6–33.0)
MCHC: 32.4 g/dL (ref 31.5–35.7)
MCV: 92 fL (ref 79–97)
Platelets: 186 x10E3/uL (ref 150–450)
RBC: 4.59 x10E6/uL (ref 3.77–5.28)
RDW: 13 % (ref 11.7–15.4)
WBC: 4.4 x10E3/uL (ref 3.4–10.8)

## 2024-08-05 LAB — LIPID PANEL
Chol/HDL Ratio: 3.7 ratio (ref 0.0–4.4)
Cholesterol, Total: 213 mg/dL — ABNORMAL HIGH (ref 100–199)
HDL: 58 mg/dL (ref >39)
LDL Chol Calc (NIH): 140 mg/dL — ABNORMAL HIGH (ref 0–99)
Triglycerides: 83 mg/dL (ref 0–149)
VLDL Cholesterol Cal: 15 mg/dL (ref 5–40)

## 2024-08-05 LAB — HEMOGLOBIN A1C
Est. average glucose Bld gHb Est-mCnc: 111 mg/dL
Hgb A1c MFr Bld: 5.5 % (ref 4.8–5.6)

## 2024-08-07 NOTE — Addendum Note (Signed)
 Addended by: EMMITT FINE T on: 08/07/2024 04:41 PM   Modules accepted: Orders

## 2025-02-02 ENCOUNTER — Ambulatory Visit: Admitting: Internal Medicine

## 2025-08-06 ENCOUNTER — Encounter: Payer: Self-pay | Admitting: Internal Medicine
